# Patient Record
Sex: Female | Born: 1955 | Race: Black or African American | Hispanic: No | Smoking: Never smoker
Health system: Southern US, Community
[De-identification: ages and names within clinical notes are randomized; demographics above are authoritative.]

## PROBLEM LIST (undated history)

## (undated) DIAGNOSIS — D649 Anemia, unspecified: Secondary | ICD-10-CM

## (undated) DIAGNOSIS — G4730 Sleep apnea, unspecified: Secondary | ICD-10-CM

## (undated) DIAGNOSIS — K279 Peptic ulcer, site unspecified, unspecified as acute or chronic, without hemorrhage or perforation: Secondary | ICD-10-CM

## (undated) DIAGNOSIS — R7303 Prediabetes: Secondary | ICD-10-CM

## (undated) DIAGNOSIS — Z8669 Personal history of other diseases of the nervous system and sense organs: Secondary | ICD-10-CM

## (undated) DIAGNOSIS — G43909 Migraine, unspecified, not intractable, without status migrainosus: Secondary | ICD-10-CM

## (undated) DIAGNOSIS — D219 Benign neoplasm of connective and other soft tissue, unspecified: Secondary | ICD-10-CM

## (undated) DIAGNOSIS — K5792 Diverticulitis of intestine, part unspecified, without perforation or abscess without bleeding: Secondary | ICD-10-CM

## (undated) DIAGNOSIS — I10 Essential (primary) hypertension: Secondary | ICD-10-CM

## (undated) DIAGNOSIS — J189 Pneumonia, unspecified organism: Secondary | ICD-10-CM

## (undated) HISTORY — DX: Diverticulitis of intestine, part unspecified, without perforation or abscess without bleeding: K57.92

## (undated) HISTORY — PX: BLADDER SURGERY: SHX569

## (undated) HISTORY — DX: Personal history of other diseases of the nervous system and sense organs: Z86.69

## (undated) HISTORY — DX: Morbid (severe) obesity due to excess calories: E66.01

## (undated) HISTORY — DX: Benign neoplasm of connective and other soft tissue, unspecified: D21.9

## (undated) HISTORY — DX: Anemia, unspecified: D64.9

## (undated) HISTORY — DX: Peptic ulcer, site unspecified, unspecified as acute or chronic, without hemorrhage or perforation: K27.9

## (undated) HISTORY — DX: Prediabetes: R73.03

## (undated) HISTORY — DX: Migraine, unspecified, not intractable, without status migrainosus: G43.909

## (undated) HISTORY — PX: DILATION AND CURETTAGE OF UTERUS: SHX78

---

## 1990-06-30 HISTORY — PX: TIBIA FRACTURE SURGERY: SHX806

## 1996-06-30 DIAGNOSIS — K279 Peptic ulcer, site unspecified, unspecified as acute or chronic, without hemorrhage or perforation: Secondary | ICD-10-CM

## 1996-06-30 HISTORY — DX: Peptic ulcer, site unspecified, unspecified as acute or chronic, without hemorrhage or perforation: K27.9

## 1999-09-17 ENCOUNTER — Other Ambulatory Visit: Admission: RE | Admit: 1999-09-17 | Discharge: 1999-09-17 | Payer: Self-pay | Admitting: Obstetrics & Gynecology

## 2002-02-23 ENCOUNTER — Encounter: Admission: RE | Admit: 2002-02-23 | Discharge: 2002-02-23 | Payer: Self-pay | Admitting: Internal Medicine

## 2002-02-23 ENCOUNTER — Encounter: Payer: Self-pay | Admitting: Internal Medicine

## 2002-03-11 ENCOUNTER — Ambulatory Visit (HOSPITAL_COMMUNITY): Admission: RE | Admit: 2002-03-11 | Discharge: 2002-03-11 | Payer: Self-pay | Admitting: Gastroenterology

## 2002-03-11 ENCOUNTER — Encounter: Payer: Self-pay | Admitting: Gastroenterology

## 2002-04-18 ENCOUNTER — Other Ambulatory Visit: Admission: RE | Admit: 2002-04-18 | Discharge: 2002-04-18 | Payer: Self-pay | Admitting: Obstetrics & Gynecology

## 2002-08-17 ENCOUNTER — Ambulatory Visit: Admission: RE | Admit: 2002-08-17 | Discharge: 2002-08-17 | Payer: Self-pay | Admitting: Gastroenterology

## 2003-06-09 ENCOUNTER — Other Ambulatory Visit: Admission: RE | Admit: 2003-06-09 | Discharge: 2003-06-09 | Payer: Self-pay | Admitting: Obstetrics & Gynecology

## 2004-06-11 ENCOUNTER — Other Ambulatory Visit: Admission: RE | Admit: 2004-06-11 | Discharge: 2004-06-11 | Payer: Self-pay | Admitting: Obstetrics & Gynecology

## 2005-07-08 ENCOUNTER — Other Ambulatory Visit: Admission: RE | Admit: 2005-07-08 | Discharge: 2005-07-08 | Payer: Self-pay | Admitting: Obstetrics & Gynecology

## 2007-07-05 ENCOUNTER — Emergency Department (HOSPITAL_COMMUNITY): Admission: EM | Admit: 2007-07-05 | Discharge: 2007-07-05 | Payer: Self-pay | Admitting: Family Medicine

## 2009-12-19 ENCOUNTER — Ambulatory Visit (HOSPITAL_COMMUNITY): Admission: RE | Admit: 2009-12-19 | Discharge: 2009-12-19 | Payer: Self-pay | Admitting: Surgery

## 2009-12-25 ENCOUNTER — Ambulatory Visit (HOSPITAL_COMMUNITY)
Admission: RE | Admit: 2009-12-25 | Discharge: 2009-12-25 | Payer: Self-pay | Source: Home / Self Care | Admitting: Surgery

## 2010-01-14 ENCOUNTER — Ambulatory Visit (HOSPITAL_COMMUNITY): Admission: RE | Admit: 2010-01-14 | Discharge: 2010-01-14 | Payer: Self-pay | Admitting: Surgery

## 2010-01-22 ENCOUNTER — Ambulatory Visit (HOSPITAL_BASED_OUTPATIENT_CLINIC_OR_DEPARTMENT_OTHER)
Admission: RE | Admit: 2010-01-22 | Discharge: 2010-01-22 | Payer: Self-pay | Source: Home / Self Care | Admitting: Surgery

## 2010-01-26 ENCOUNTER — Ambulatory Visit: Payer: Self-pay | Admitting: Internal Medicine

## 2010-02-11 ENCOUNTER — Encounter: Admission: RE | Admit: 2010-02-11 | Discharge: 2010-03-29 | Payer: Self-pay | Admitting: Surgery

## 2010-11-15 NOTE — Op Note (Signed)
   NAME:  Jacqueline Mcclure, Jacqueline Mcclure                       ACCOUNT NO.:  [id number]   MEDICAL RECORD NO.:  [id number]                   PATIENT TYPE:  AMB   LOCATION:  [facility]                                 FACILITY:  [facility]   PHYSICIAN:  Anselmo Rod, M.D.               DATE OF BIRTH:  1955-09-15   DATE OF PROCEDURE:  08/17/2002  DATE OF DISCHARGE:                                 OPERATIVE REPORT   PROCEDURE:  Esophagogastroduodenoscopy.   ENDOSCOPIST:  Anselmo Rod, M.D.   INSTRUMENT USED:  Olympus video panendoscope.   INDICATION FOR PROCEDURE:  A 55 year old African-American female with  abdominal pain.  Rule out peptic ulcer disease, esophagitis, gastritis, etc.  The patient has been on Prevacid but has had recurrent reflux.   PREPROCEDURE PREPARATION:  Informed consent was procured from the patient.  The patient fasted for eight hours prior to the procedure.   PREPROCEDURE PHYSICAL:  VITAL SIGNS:  The patient had stable vital signs.  NECK:  Supple.  CHEST:  Clear to auscultation.  S1, S2 regular.  ABDOMEN:  Obese with epigastric tenderness on palpation with guarding.  No  rebound or rigidity, no hepatosplenomegaly.   DESCRIPTION OF PROCEDURE:  The patient was placed in the left lateral  decubitus position and sedated with 80 mg of Demerol and 9 mg of Versed  intravenously.  Once the patient was adequately sedate and maintained on low-  flow oxygen and continuous cardiac monitoring, the Olympus video  panendoscope was advanced through the mouthpiece, over the tongue, into the  esophagus under direct vision.  The entire esophagus appeared normal with no  evidence of ring, stricture, masses, esophagitis, or Barrett's mucosa.  The  scope was then advanced into the stomach.  Entire gastric mucosa and the  proximal small bowel appeared normal.  Retroflexion revealed no acute  abnormality.  The patient tolerated the procedure well without  complications.   IMPRESSION:  Normal  EGD.   RECOMMENDATIONS:  1. Continue PPIs.  2.     Avoid nonsteroidals.  3. Follow antireflux measures.  4. CT scan of the abdomen and pelvis if symptoms persist.                                               Anselmo Rod, M.D.    JNM/MEDQ  D:  08/17/2002  T:  08/17/2002  Job:  [id number]   cc:   Ike Bene, M.D.  [address]  [place]  [place] [address]  Fax: [phone]

## 2011-03-20 LAB — I-STAT 8, (EC8 V) (CONVERTED LAB)
Acid-base deficit: 1
BUN: 8
Bicarbonate: 27.2 — ABNORMAL HIGH
Chloride: 108
Glucose, Bld: 94
HCT: 42
Hemoglobin: 14.3
Operator id: 146091
Potassium: 4
Sodium: 139
TCO2: 29
pCO2, Ven: 56.6 — ABNORMAL HIGH
pH, Ven: 7.29

## 2011-03-20 LAB — CBC
HCT: 37.5
Hemoglobin: 12.3
MCHC: 32.8
MCV: 79.5
Platelets: 302
RBC: 4.72
RDW: 14.5
WBC: 8.4

## 2011-03-20 LAB — POCT CARDIAC MARKERS
Myoglobin, poc: 124
Operator id: 146091

## 2011-03-20 LAB — DIFFERENTIAL
Eosinophils Relative: 1
Lymphs Abs: 1.4
Monocytes Relative: 7

## 2011-03-20 LAB — D-DIMER, QUANTITATIVE: D-Dimer, Quant: 0.48

## 2012-01-20 ENCOUNTER — Other Ambulatory Visit: Payer: Self-pay | Admitting: Gastroenterology

## 2012-01-20 DIAGNOSIS — R1031 Right lower quadrant pain: Secondary | ICD-10-CM

## 2012-01-23 ENCOUNTER — Ambulatory Visit
Admission: RE | Admit: 2012-01-23 | Discharge: 2012-01-23 | Disposition: A | Discharge status: Home / Self Care | Payer: Managed Care, Other (non HMO) | Source: Ambulatory Visit | Attending: Gastroenterology | Admitting: Gastroenterology

## 2012-01-23 DIAGNOSIS — R1031 Right lower quadrant pain: Secondary | ICD-10-CM

## 2012-01-23 MED ORDER — IOHEXOL 300 MG/ML  SOLN
125.0000 mL | Freq: Once | INTRAMUSCULAR | Status: AC | PRN
  Administered 2012-01-23: 125 mL via INTRAVENOUS

## 2012-03-03 ENCOUNTER — Encounter (INDEPENDENT_AMBULATORY_CARE_PROVIDER_SITE_OTHER): Payer: Managed Care, Other (non HMO) | Admitting: Surgery

## 2012-04-16 ENCOUNTER — Other Ambulatory Visit: Payer: Self-pay | Admitting: Gastroenterology

## 2012-04-19 ENCOUNTER — Encounter (HOSPITAL_COMMUNITY): Payer: Self-pay | Admitting: *Deleted

## 2012-04-19 ENCOUNTER — Encounter (HOSPITAL_COMMUNITY)
Admission: RE | Disposition: A | Discharge status: Home / Self Care | Payer: Self-pay | Source: Ambulatory Visit | Attending: Gastroenterology

## 2012-04-19 ENCOUNTER — Ambulatory Visit (HOSPITAL_COMMUNITY)
Admission: RE | Admit: 2012-04-19 | Discharge: 2012-04-19 | Disposition: A | Discharge status: Home / Self Care | Payer: Managed Care, Other (non HMO) | Source: Ambulatory Visit | Attending: Gastroenterology | Admitting: Gastroenterology

## 2012-04-19 DIAGNOSIS — Z79899 Other long term (current) drug therapy: Secondary | ICD-10-CM | POA: Insufficient documentation

## 2012-04-19 DIAGNOSIS — I10 Essential (primary) hypertension: Secondary | ICD-10-CM | POA: Insufficient documentation

## 2012-04-19 DIAGNOSIS — G4730 Sleep apnea, unspecified: Secondary | ICD-10-CM | POA: Insufficient documentation

## 2012-04-19 DIAGNOSIS — Z1211 Encounter for screening for malignant neoplasm of colon: Secondary | ICD-10-CM | POA: Insufficient documentation

## 2012-04-19 HISTORY — PX: COLONOSCOPY: SHX5424

## 2012-04-19 HISTORY — DX: Sleep apnea, unspecified: G47.30

## 2012-04-19 HISTORY — DX: Essential (primary) hypertension: I10

## 2012-04-19 SURGERY — COLONOSCOPY
Anesthesia: Moderate Sedation

## 2012-04-19 MED ORDER — FENTANYL CITRATE 0.05 MG/ML IJ SOLN
INTRAMUSCULAR | Status: AC
  Filled 2012-04-19: qty 4

## 2012-04-19 MED ORDER — MIDAZOLAM HCL 10 MG/2ML IJ SOLN
INTRAMUSCULAR | Status: AC
  Filled 2012-04-19: qty 4

## 2012-04-19 MED ORDER — FENTANYL CITRATE 0.05 MG/ML IJ SOLN
INTRAMUSCULAR | Status: DC | PRN
  Administered 2012-04-19 (×3): 25 ug via INTRAVENOUS

## 2012-04-19 MED ORDER — MIDAZOLAM HCL 10 MG/2ML IJ SOLN
INTRAMUSCULAR | Status: DC | PRN
  Administered 2012-04-19 (×3): 2.5 mg via INTRAVENOUS

## 2012-04-19 MED ORDER — SODIUM CHLORIDE 0.9 % IV SOLN
INTRAVENOUS | Status: DC
  Administered 2012-04-19: 500 mL via INTRAVENOUS

## 2012-04-19 NOTE — Op Note (Signed)
[facility] [address] [place], [address]   OPERATIVE PROCEDURE REPORT  PATIENT: Jacqueline Mcclure, Jacqueline Mcclure  MR#: [id number] BIRTHDATE: 10/05/1955 GENDER: Female ENDOSCOPIST: Dr.  Lorenza Burton, MD ASSISTANT:   Karie Soda, Technician Debi Mays, RN, [facility] PROCEDURE DATE: 04/19/2012  PRE-PROCEDURE PREPARATION: The patient was prepped with 32 oz.  of Suprep the night before the procedure and 32 oz.  the morning of the procedure.  The patient was fasted for 4 hours prior to the procedure. PRE-PROCEDURE PHYSICAL: Patient has stable vital signs.  Neck is supple.  There is no JVD, thyromegaly or LAD.  Chest clear to auscultation.  S1 and S2 regular.  Abdomen soft, morbidly obese, non-distended, non-tender with NABS. PROCEDURE: Colonoscopy with hot snare polypectomy x 1 and cold biopsy x 1. ASA CLASS: Class III INDICATIONS: 1. Colorectal cancer screening-average risk patient. MEDICATIONS:  Fentanyl 75 mcg and Versed 7 mg IV .  DESCRIPTION OF PROCEDURE:   After the risks, benefits, and alternatives of the procedure were thoroughly explained [including a 10% missed rate of cancer and polyps], informed consent was obtained.  Digital rectal exam was performed.  The Pentax Colonoscope [id number]  was introduced through the anus  and advanced to the cecum, which was identified by both the appendix and ileocecal valve , limited by No adverse events experienced.   The quality of the prep was Suprep good . Multiple washes were done. Small lesions could be missed. The instrument was then slowly withdrawn as the colon was fully examined.     COLON FINDINGS: A diminutive polyp was removed by cold biopsy from the rectosigmoid colon. A 5-6 mm sessile polyp was removed from the rectum using a snare cautery 150/15 x 1 . The resection was complete and the polyp tissue was completely retrieved. Scattered diverticula were noted throughout the colon. The rest of the colonic mucosa  appeared healthy with a normal vascular pattern.  No masses or AVMs were noted.  The appendiceal orifice and the ICV were identified and photographed. Retroflexed views revealed small internal hemorrhoids. The patient tolerated the procedure without immediate complications.  The scope was then withdrawn from the patient and the procedure terminated.  TIME TO CECUM:  14 minutes 0 seconds WITHDRAW TIME:  8 minutes 0 seconds  IMPRESSION:     1) Diminutive rectosigmoid polyp removed by cold biopsy x 1. 2) Sessile 5-6 mm rectal polyp removed by a hot snare x 1. 3) Scattered diverticulosis.   RECOMMENDATIONS:     1.  Await pathology results. 2.  Continue surveillance. 3.  High fiber diet with liberal fluid intake. 4.  OP follow-up is advised on a PRN basis.   REPEAT EXAM:      In 5 years  for a repeat colonoscopy.  If the patient has any abnormal GI symptoms in the interim, they have been advised to contact the office as soon as possible for further recommendations.   CPT CODES:     [id number], Colonoscopy with snare polypectomy  DIAGNOSIS CODES:     V76.51, 211.3, 562.10, 455.0  REFERRED YQ:MVHQIO Ehinger, M.D.  Ilda Mori, M.D.  eSigned:  Dr. Lorenza Burton, MD 04/19/2012 3:25 PM   PATIENT NAME:  Jacqueline Mcclure, Jacqueline Mcclure MR#: [id number]

## 2012-04-19 NOTE — H&P (Signed)
Jacqueline Mcclure is an 56 y.o. female.   Chief Complaint: Colorectal cancer screening. HPI:  Patient is here for colorectal cancer screening. She denies any new GI symtoms at this time.  Past Medical History  Diagnosis Date  . Hypertension   . Sleep apnea     No CPAP use    Past Surgical History  Procedure Date  . Tibia fracture surgery 1992    8 pins  . Knee surgery 1992    2 pins  . Bladder surgery     Stretched  . Dilation and curettage of uterus     History reviewed. No pertinent family history. Social History:  reports that she has never smoked. She does not have any smokeless tobacco history on file. She reports that she does not drink alcohol or use illicit drugs.  Allergies: Not on File  Medications Prior to Admission  Medication Sig Dispense Refill  . hydrochlorothiazide (HYDRODIURIL) 25 MG tablet Take 25 mg by mouth daily.      Marland Kitchen lisinopril (PRINIVIL,ZESTRIL) 20 MG tablet Take 20 mg by mouth daily.       No results found for this or any previous visit (from the past 48 hour(s)). No results found.  Review of Systems  Constitutional: Negative.   Eyes: Negative.   Respiratory: Negative.   Cardiovascular: Negative.   Genitourinary: Negative.   Musculoskeletal: Positive for myalgias, back pain and joint pain.  Skin: Negative.     Blood pressure 112/73, pulse 95, temperature 98.1 F (36.7 C), temperature source Oral, resp. rate 12, height 5\' 5"  (1.651 m), weight 173.728 kg (383 lb), SpO2 97.00%. Physical Exam  Constitutional: She is oriented to person, place, and time. She appears well-developed and well-nourished.  HENT:  Head: Normocephalic and atraumatic.  Eyes: Conjunctivae normal and EOM are normal. Pupils are equal, round, and reactive to light.  Neck: Normal range of motion. Neck supple.  Cardiovascular: Normal rate and regular rhythm.   Respiratory: Effort normal and breath sounds normal.  GI: Soft. Bowel sounds are normal.  Musculoskeletal: Normal  range of motion.  Neurological: She is alert and oriented to person, place, and time.  Skin: Skin is warm and dry.  Psychiatric: She has a normal mood and affect. Her behavior is normal. Judgment and thought content normal.     Assessment/Plan Colorectal cancer screening: proceed with a colonoscopy at this time.  Jacqueline Mcclure 04/19/2012, 2:38 PM

## 2012-04-20 ENCOUNTER — Encounter (HOSPITAL_COMMUNITY): Payer: Self-pay | Admitting: Gastroenterology

## 2012-06-26 ENCOUNTER — Emergency Department (HOSPITAL_BASED_OUTPATIENT_CLINIC_OR_DEPARTMENT_OTHER)
Admission: EM | Admit: 2012-06-26 | Discharge: 2012-06-26 | Disposition: A | Discharge status: Home / Self Care | Payer: Managed Care, Other (non HMO) | Attending: Emergency Medicine | Admitting: Emergency Medicine

## 2012-06-26 ENCOUNTER — Encounter (HOSPITAL_BASED_OUTPATIENT_CLINIC_OR_DEPARTMENT_OTHER): Payer: Self-pay | Admitting: *Deleted

## 2012-06-26 ENCOUNTER — Emergency Department (HOSPITAL_BASED_OUTPATIENT_CLINIC_OR_DEPARTMENT_OTHER): Payer: Managed Care, Other (non HMO)

## 2012-06-26 DIAGNOSIS — R197 Diarrhea, unspecified: Secondary | ICD-10-CM | POA: Insufficient documentation

## 2012-06-26 DIAGNOSIS — I10 Essential (primary) hypertension: Secondary | ICD-10-CM | POA: Insufficient documentation

## 2012-06-26 DIAGNOSIS — J40 Bronchitis, not specified as acute or chronic: Secondary | ICD-10-CM

## 2012-06-26 DIAGNOSIS — R0602 Shortness of breath: Secondary | ICD-10-CM | POA: Insufficient documentation

## 2012-06-26 DIAGNOSIS — J3489 Other specified disorders of nose and nasal sinuses: Secondary | ICD-10-CM | POA: Insufficient documentation

## 2012-06-26 DIAGNOSIS — Z79899 Other long term (current) drug therapy: Secondary | ICD-10-CM | POA: Insufficient documentation

## 2012-06-26 DIAGNOSIS — Z8709 Personal history of other diseases of the respiratory system: Secondary | ICD-10-CM | POA: Insufficient documentation

## 2012-06-26 MED ORDER — ALBUTEROL SULFATE HFA 108 (90 BASE) MCG/ACT IN AERS
2.0000 | INHALATION_SPRAY | RESPIRATORY_TRACT | Status: DC | PRN
  Administered 2012-06-26: 2 via RESPIRATORY_TRACT
  Filled 2012-06-26: qty 6.7

## 2012-06-26 NOTE — ED Notes (Addendum)
States that she has a cough, congestion, and headache since last week. Was given amoxicillin last week but stopped taking it because it  Made her sick. Also states she took 3 days of a z-pack

## 2012-06-26 NOTE — ED Provider Notes (Signed)
History   This chart was scribed for Ethelda Chick, MD scribed by Magnus Sinning. The patient was seen in room MH09/MH09 at 15:45   CSN: [id number]  Arrival date & time 06/26/12  1509    Chief Complaint  Patient presents with  . Cough    (Consider location/radiation/quality/duration/timing/severity/associated sxs/prior treatment) HPI Comments: Jacqueline Mcclure is a 56 y.o. female who presents to the Emergency Department complaining of constant moderate cough, onset one week with associated congestion, sinus pressure, SOB with exertion, wheezing, and diarrhea.  The patient states her illness began with a head cold that she treated with OTC medications with relief.   One week ago,she reportedly began having constant moderate cough with congestion. She was seen at [facility] walk-in clinic and given amoxil six days ago. She states she only took it for three days and stopped it because she believed it was causing stomach upset and abd sxs.  She was seen again by a provided new onset right sided sinus pressure. She says she was started on a Z-pack  three days ago and completed today. She states she has been eating and drinking,but provides her mouth continually feels dry. Also reports diarrhea yesterday for one hour and loose stool today. She states previous abd pain that she says is resolved currently.  Does report hx of bronchitis and hx of HTN, but denies alcohol use, or smoking.   Patient is a 56 y.o. female presenting with cough. The history is provided by the patient. No language interpreter was used.  Cough This is a new problem. The current episode started more than 1 week ago. The problem occurs constantly. The problem has been gradually worsening. There has been no fever. Associated symptoms include shortness of breath and wheezing. The treatment provided mild relief. She is not a smoker. Her past medical history is significant for bronchitis.    Past Medical History  Diagnosis Date    . Hypertension   . Sleep apnea     No CPAP use    Past Surgical History  Procedure Date  . Tibia fracture surgery 1992    8 pins  . Knee surgery 1992    2 pins  . Bladder surgery     Stretched  . Dilation and curettage of uterus   . Colonoscopy 04/19/2012    Procedure: COLONOSCOPY;  Surgeon: Charna Elizabeth, MD;  Location: [facility];  Service: Endoscopy;  Laterality: N/A;    No family history on file.  History  Substance Use Topics  . Smoking status: Never Smoker   . Smokeless tobacco: Not on file  . Alcohol Use: No    Review of Systems  HENT: Positive for congestion and sinus pressure.   Respiratory: Positive for cough, shortness of breath and wheezing.   Gastrointestinal: Positive for abdominal pain and diarrhea.  All other systems reviewed and are negative.    Allergies  Review of patient's allergies indicates no known allergies.  Home Medications   Current Outpatient Rx  Name  Route  Sig  Dispense  Refill  . HYDROCHLOROTHIAZIDE 25 MG PO TABS   Oral   Take 25 mg by mouth daily.         Marland Kitchen LISINOPRIL 20 MG PO TABS   Oral   Take 20 mg by mouth daily.           BP 124/76  Pulse 97  Temp 97.8 F (36.6 C) (Oral)  Resp 18  Ht 5\' 5"  (1.651 m)  Wt 390  lb (176.903 kg)  BMI 64.90 kg/m2  SpO2 99%  Physical Exam  Nursing note and vitals reviewed. Constitutional: She is oriented to person, place, and time. She appears well-developed and well-nourished. No distress.  HENT:  Head: Normocephalic and atraumatic.  Mouth/Throat: Oropharynx is clear and moist. No oropharyngeal exudate or posterior oropharyngeal erythema.  Eyes: Conjunctivae normal and EOM are normal.  Neck: Neck supple. No tracheal deviation present.  Cardiovascular: Normal rate, regular rhythm and normal heart sounds.   Pulmonary/Chest: Effort normal and breath sounds normal. No respiratory distress. She has no wheezes. She has no rales.  Abdominal: Soft. Bowel sounds are normal. She  exhibits no distension. There is no tenderness.  Musculoskeletal: Normal range of motion. She exhibits no edema.  Neurological: She is alert and oriented to person, place, and time. No sensory deficit.  Skin: Skin is dry.  Psychiatric: She has a normal mood and affect. Her behavior is normal.    ED Course  Procedures (including critical care time) DIAGNOSTIC STUDIES: Oxygen Saturation is 99% on room air, normal by my interpretation.    COORDINATION OF CARE: 15:49: Physical exam performed.  Labs Reviewed - No data to display Dg Chest 2 View  06/26/2012  *RADIOLOGY REPORT*  Clinical Data: Cough, congestion.  Headaches since last week. History of hypertension, sleep apnea.  CHEST - 2 VIEW  Comparison: 12/25/2009 and earlier  Findings: The cardiomediastinal silhouette is within normal limits. Lungs are free of focal consolidations and pleural effusions.  No pulmonary edema.  Degenerative changes are seen in the lower thoracic spine.  IMPRESSION: No evidence for acute cardiopulmonary abnormality.   Original Report Authenticated By: Norva Pavlov, M.D.      1. Bronchitis       MDM  Pt presents with c/o cough, nasal congestion, generalized fatigue- has taken 3 days of amoxicillin and 3 day course of zpack which finished today.  Her lungs are clear, abdomen benign, she appears well hydrated.  CXR reassuring- images reviewed by me as well.  Pt given albuterol inhaler in case of post viral bronchitis.  She is a nonsmoker.  Discharged with strict return precautions.  Pt agreeable with plan.   I personally performed the services described in this documentation, which was scribed in my presence. The recorded information has been reviewed and is accurate.         Ethelda Chick, MD 06/27/12 1536

## 2012-07-30 ENCOUNTER — Encounter (INDEPENDENT_AMBULATORY_CARE_PROVIDER_SITE_OTHER): Payer: Self-pay

## 2013-05-03 ENCOUNTER — Other Ambulatory Visit: Payer: Self-pay | Admitting: Obstetrics & Gynecology

## 2013-05-03 DIAGNOSIS — R928 Other abnormal and inconclusive findings on diagnostic imaging of breast: Secondary | ICD-10-CM

## 2013-05-20 ENCOUNTER — Ambulatory Visit
Admission: RE | Admit: 2013-05-20 | Discharge: 2013-05-20 | Disposition: A | Discharge status: Home / Self Care | Payer: Managed Care, Other (non HMO) | Source: Ambulatory Visit | Attending: Obstetrics & Gynecology | Admitting: Obstetrics & Gynecology

## 2013-05-20 DIAGNOSIS — R928 Other abnormal and inconclusive findings on diagnostic imaging of breast: Secondary | ICD-10-CM

## 2013-12-16 ENCOUNTER — Ambulatory Visit (HOSPITAL_COMMUNITY): Admit: 2013-12-16 | Payer: Managed Care, Other (non HMO) | Admitting: Obstetrics & Gynecology

## 2013-12-16 ENCOUNTER — Encounter (HOSPITAL_COMMUNITY): Payer: Self-pay

## 2013-12-16 SURGERY — DILATATION AND CURETTAGE /HYSTEROSCOPY
Anesthesia: Choice

## 2014-01-19 ENCOUNTER — Other Ambulatory Visit (HOSPITAL_COMMUNITY): Payer: Self-pay | Admitting: Surgery

## 2014-01-23 ENCOUNTER — Other Ambulatory Visit (HOSPITAL_COMMUNITY): Payer: Self-pay | Admitting: Surgery

## 2014-01-27 ENCOUNTER — Ambulatory Visit (HOSPITAL_COMMUNITY)
Admission: RE | Admit: 2014-01-27 | Discharge: 2014-01-27 | Disposition: A | Discharge status: Home / Self Care | Payer: Managed Care, Other (non HMO) | Source: Ambulatory Visit | Attending: Surgery | Admitting: Surgery

## 2014-02-28 ENCOUNTER — Ambulatory Visit (INDEPENDENT_AMBULATORY_CARE_PROVIDER_SITE_OTHER): Payer: Managed Care, Other (non HMO) | Admitting: Psychiatry

## 2014-02-28 ENCOUNTER — Encounter (HOSPITAL_COMMUNITY): Payer: Self-pay | Admitting: Psychiatry

## 2014-02-28 VITALS — BP 110/73 | HR 98 | Wt 372.6 lb

## 2014-02-28 DIAGNOSIS — Z0389 Encounter for observation for other suspected diseases and conditions ruled out: Secondary | ICD-10-CM

## 2014-02-28 DIAGNOSIS — F489 Nonpsychotic mental disorder, unspecified: Secondary | ICD-10-CM

## 2014-02-28 NOTE — Progress Notes (Signed)
Psychiatric Assessment Adult  Patient Identification:  Jacqueline Mcclure Date of Evaluation:  02/28/2014 Chief Complaint: I was sent for a bariatric evaluation  History of Chief Complaint:   Chief Complaint  Patient presents with  . Other    HPI Comments: Pt wants to have bariatric surgery. Her weight has increased to the point that she is struggling physically. Weight hinders her from a lot of activities. States her weight causes other people to look at her and makes her self conscious.   Pt enjoys eating "junky food that is not healthy". She likes to eat things that taste good. Pt eats somewhere 1-3 meals/day. States meals are a regular size and she does not super size. Pt snacks in between meals. She likes cheeseburgers, Oreo cookie milkshakes, fries, pizza as regular meals. In between meals she will eat ice cream, peanut butter M&M's, candy, milkshakes. Pt will eat about 1/3 of a bag of candy at each snack.  Most of her snacking occurs once in the evening while at work and again at night when she goes home. States she has control on the amount she eats and she eats until she is full then stops. Her biggest issues are with her food choices. Pt craves carbs and junky food.   Pt began gaining weight when she was 15. At 18 she tried diet pills and she lost 80 lbs. She was able to keep the weight off for several years. Weight began to increase again and she tried more diet pills and weight clinics. States she would get results but was never able to maintain the weight loss.   Sleep is poor due to fear of the "buggy man". States she has dreams and when she wakes up she has VH. It makes her scared and not want to sleep. Pt also possibility has untreated sleep apnea. Energy and concentration are good. She is working and productive.     Review of Systems Physical Exam  Constitutional: She is cooperative.  Psychiatric: She has a normal mood and affect. Her speech is normal and behavior is normal.  Judgment and thought content normal. Cognition and memory are normal.    Depressive Symptoms: depressed mood about her weight and especially when shopping. States overall mood is ok and she is not depressed on a daily basis. Denies anhedonia, worthlessness and hopelessness. Denies SI/HI.   (Hypo) Manic Symptoms:   Elevated Mood:  No Irritable Mood:  No Grandiosity:  No Distractibility:  No Labiality of Mood:  No Delusions:  No Hallucinations:  No Impulsivity:  No Sexually Inappropriate Behavior:  No Financial Extravagance:  No Flight of Ideas:  No  Anxiety Symptoms: Excessive Worry:  No Panic Symptoms:  No Agoraphobia:  No Obsessive Compulsive: No  Symptoms: None, Specific Phobias:  No Social Anxiety:  No  Psychotic Symptoms:  Hallucinations: No None Delusions:  No Paranoia:  No   Ideas of Reference:  No  PTSD Symptoms: Ever had a traumatic exposure:  No Had a traumatic exposure in the last month:  No Re-experiencing: No None Hypervigilance:  No Hyperarousal: No None Avoidance: No None  Traumatic Brain Injury: No   Past Psychiatric History: Diagnosis: denies  Hospitalizations: denies  Outpatient Care: went for psych eval for lap band surgery in 2008  Substance Abuse Care: denies  Self-Mutilation: denies  Suicidal Attempts: denies, denies access to guns.   Violent Behaviors: denies   Past Medical History:   Past Medical History  Diagnosis Date  . Hypertension   .  Sleep apnea     No CPAP use   History of Loss of Consciousness:  No Seizure History:  No Cardiac History:  No Allergies:  No Known Allergies Current Medications:  Current Outpatient Prescriptions  Medication Sig Dispense Refill  . hydrochlorothiazide (HYDRODIURIL) 25 MG tablet Take 25 mg by mouth daily.      Marland Kitchen lisinopril (PRINIVIL,ZESTRIL) 20 MG tablet Take 20 mg by mouth daily.       No current facility-administered medications for this visit.    Previous Psychotropic  Medications:  Medication Dose   denies                       Substance Abuse History in the last 12 months: Substance Age of 1st Use Last Use Amount Specific Type  Nicotine  denies        Alcohol  denies        Cannabis  denies        Opiates  denies        Cocaine  denies        Methamphetamines  denies        LSD  denies        Ecstasy  denies         Benzodiazepines  denies        Caffeine    A few times a week about 1/2 bottle coke  Inhalants  denies        Others: denies                         Medical Consequences of Substance Abuse: denies  Legal Consequences of Substance Abuse: denies  Family Consequences of Substance Abuse: denies  Blackouts:  No DT's:  No Withdrawal Symptoms:  No None  Social History: Current Place of Residence: [place] with husband Place of Birth: [place] Family Members: parents, 1 bro and 1 sister Marital Status:  Married 54 yrs Children: 1  Sons: 1 in college  Daughters: 0 Relationships: support  From husband and a few friends Education:  2 yrs college Educational Problems/Performance: denies Religious Beliefs/Practices:Christian History of Abuse: none Occupational Experiences: Psychologist, clinical since 2008 Military History:  None. Legal History: denies Hobbies/Interests: bowling  Family History:   Family History  Problem Relation Age of Onset  . Suicidality Neg Hx   . Alcohol abuse Neg Hx   . Anxiety disorder Neg Hx   . Bipolar disorder Neg Hx   . Depression Neg Hx   . Drug abuse Neg Hx     Mental Status Examination/Evaluation: Objective: Attitude: Calm and cooperative  Appearance: Fairly Groomed, appears to be stated age  Eye Contact::  Good  Speech:  Clear and Coherent and Normal Rate  Volume:  Normal  Mood:  euthymic  Affect:  Full Range  Thought Process:  Goal Directed, Intact, Linear and Logical  Orientation:  Full (Time, Place, and Person)  Thought Content:  Negative  Suicidal Thoughts:   No  Homicidal Thoughts:  No  Judgement:  Good  Insight:  Good  Concentration: good  Memory: Immediate-fair Recent-fair Remote-fair  Recall: fair  Language: fair  Gait and Station: normal  [employer] of Knowledge: average  Psychomotor Activity:  Normal  Akathisia:  No  Handed:  Right  AIMS (if indicated):  n/a  Assets:  Communication Skills Desire for Improvement Financial Resources/Insurance Housing Intimacy Leisure Time Social Support Heritage manager  Laboratory/X-Ray Psychological Evaluation(s)  None available to review None available to review   Assessment:  Pt reported VH appear to normal physiological response. No signs of an eating disorder. Weight issues appear to be more related to poor food choices. No reported symptoms of depression, bipolar or anxiety.   AXIS I No axis I diagnosis  AXIS II Deferred  AXIS III Past Medical History  Diagnosis Date  . Hypertension   . Sleep apnea     No CPAP use     AXIS IV poor food choices and health stressors  AXIS V 75   Treatment Plan/Recommendations:  Plan of Care: Pt denies all symptoms of psych illness. No further psych f/up needed at this time.   Laboratory:  none  Psychotherapy: Therapy: brief supportive therapy provided. Discussed psychosocial stressors in detail.     Medications: none  Routine PRN Medications:  No  Consultations: none  Safety Concerns:  Pt denies SI and is at an acute low risk for suicide. Patient told to call clinic if any problems occur. Patient advised to go to ER if they should develop SI/HI, side effects, or if symptoms worsen. Has crisis numbers to call if needed. Pt verbalized understanding.   Other:  Prn f/up    Charlcie Cradle, MD 9/1/201511:16 AM

## 2014-03-09 ENCOUNTER — Ambulatory Visit (HOSPITAL_COMMUNITY): Payer: Managed Care, Other (non HMO) | Admitting: Psychiatry

## 2014-04-11 ENCOUNTER — Encounter: Payer: Self-pay | Admitting: General Surgery

## 2014-04-11 DIAGNOSIS — I10 Essential (primary) hypertension: Secondary | ICD-10-CM | POA: Insufficient documentation

## 2014-04-13 ENCOUNTER — Ambulatory Visit (HOSPITAL_BASED_OUTPATIENT_CLINIC_OR_DEPARTMENT_OTHER): Payer: Managed Care, Other (non HMO) | Attending: Surgery | Admitting: Radiology

## 2014-04-13 DIAGNOSIS — G4733 Obstructive sleep apnea (adult) (pediatric): Secondary | ICD-10-CM | POA: Diagnosis not present

## 2014-04-13 DIAGNOSIS — Z6841 Body Mass Index (BMI) 40.0 and over, adult: Secondary | ICD-10-CM | POA: Insufficient documentation

## 2014-04-13 DIAGNOSIS — G4710 Hypersomnia, unspecified: Secondary | ICD-10-CM | POA: Diagnosis present

## 2014-04-13 DIAGNOSIS — G4730 Sleep apnea, unspecified: Secondary | ICD-10-CM | POA: Diagnosis present

## 2014-04-15 NOTE — Sleep Study (Signed)
   NAME: Jacqueline Mcclure DATE OF BIRTH:  [birth year] MEDICAL RECORD NUMBER [id number]  LOCATION: [facility]  PHYSICIAN: YOUNG,CLINTON D  DATE OF STUDY: 04/13/2014  SLEEP STUDY TYPE: Nocturnal Polysomnogram               REFERRING PHYSICIAN: Shelah Lewandowsky, MD  INDICATION FOR STUDY: Hypersomnia with sleep apnea  EPWORTH SLEEPINESS SCORE:   7/24 HEIGHT: 5\' 5"  (165.1 cm)  WEIGHT: 372 lb (168.738 kg)    Body mass index is 61.9 kg/(m^2).  NECK SIZE: 17 in.  MEDICATIONS: Charted for review  SLEEP ARCHITECTURE: Total sleep time 233.5 minutes with sleep efficiency 63.5%. Stage I was 6.4%, stage II 73%, stage III absent, REM 20.6% of total sleep time. Sleep latency 19 minutes, REM latency 111.5 minutes, awake after sleep onset 108.5 minutes, arousal index 11, bedtime medication: None  RESPIRATORY DATA: Apnea hypopneas index (AHI) 16.2 per hour. 63 total events scored including 16 obstructive apneas, 4 central apneas, 43 hypopneas. Events were not positional. REM AHI 65 per hour. There were not enough early events to permit application of split protocol CPAP titration on this study.  OXYGEN DATA: Mild to moderate snoring with oxygen desaturation to a nadir of 69% and mean saturation 93.3% on room air.  CARDIAC DATA: Normal sinus rhythm  MOVEMENT/PARASOMNIA: No significant movement disturbance, bathroom x1  IMPRESSION/ RECOMMENDATION:   1) Moderate obstructive sleep apnea/hypopneas syndrome, AHI 16.2 per hour with non-positional events. REM AHI 65 per hour. Mild to moderate snoring with oxygen desaturation to a nadir of 69% and mean saturation 93.3% on room air. 2) There were not enough early events to permit application of split protocol CPAP titration on this study. This patient can return for a dedicated CPAP titration study if appropriate.  3) A previous polysomnogram on 01/22/2010 recorded AHI 16.6 per hour with body weight 382 pounds.  Deneise Lever Diplomate,  American Board of Sleep Medicine  ELECTRONICALLY SIGNED ON:  04/15/2014, 2:05 PM [place] PH: [phone]   FX: [phone] [place]

## 2014-05-31 ENCOUNTER — Telehealth: Payer: Self-pay | Admitting: Interventional Cardiology

## 2014-05-31 NOTE — Telephone Encounter (Signed)
Called pt to get more info.lmtcb

## 2014-05-31 NOTE — Telephone Encounter (Signed)
Pt informed that she has not been seen by Dr.Smith in over a year. Pt will need an appt to have cardiac clearance granted for bariatric sx with Dr.Enochs. Pt sts that they are wanting to scheduled her appt this month. Adv her that Dr.Smith does not have any availability.I will fwd a message to the schedulers in our office to call pt to schedule a appt with the PA/NP for cardiac clearance.she verbalized understanding.

## 2014-05-31 NOTE — Telephone Encounter (Signed)
New problem   Pt stated she saw Dr Tamala Julian at old location and need to know if her bariatric forms has been received for Dr Tamala Julian to fill out. Please call pt is any questions.

## 2014-06-05 NOTE — Telephone Encounter (Signed)
Follow Up  Pt states that she was to receive a call to schedule for clearance. Made appt with pt with Ermalinda Barrios on 06/12/2014.

## 2014-06-09 ENCOUNTER — Ambulatory Visit (INDEPENDENT_AMBULATORY_CARE_PROVIDER_SITE_OTHER): Payer: Managed Care, Other (non HMO) | Admitting: Nurse Practitioner

## 2014-06-09 ENCOUNTER — Encounter: Payer: Self-pay | Admitting: Nurse Practitioner

## 2014-06-09 VITALS — BP 160/70 | HR 84 | Ht 65.0 in | Wt 377.0 lb

## 2014-06-09 DIAGNOSIS — Z01810 Encounter for preprocedural cardiovascular examination: Secondary | ICD-10-CM

## 2014-06-09 DIAGNOSIS — I10 Essential (primary) hypertension: Secondary | ICD-10-CM

## 2014-06-09 DIAGNOSIS — E669 Obesity, unspecified: Secondary | ICD-10-CM | POA: Insufficient documentation

## 2014-06-09 NOTE — Addendum Note (Signed)
Addended by: Alvina Filbert B on: 06/09/2014 11:53 AM   Modules accepted: Orders, Medications

## 2014-06-09 NOTE — Progress Notes (Signed)
Patient Name: Jacqueline Mcclure Date of Encounter: 06/09/2014  Primary Care Provider:  Wenda Low, MD Primary Cardiologist:  Linard Millers, MD   Patient Profile  58 y/o female with a h/o HTN and morbid obesity who presents today for pre-operative clearance for bariatric surgery.  Problem List   Past Medical History  Diagnosis Date  . Hypertension   . Sleep apnea     No CPAP use  . Migraine   . Peptic ulcer disease 1998    normal EGD 2004  . Anemia   . Morbid obesity   . Diverticulitis 12/2011  . Borderline diabetes    Past Surgical History  Procedure Laterality Date  . Tibia fracture surgery  1992    8 pins  . Knee surgery  1992    2 pins  . Bladder surgery      Stretched  . Dilation and curettage of uterus    . Colonoscopy  04/19/2012    Procedure: COLONOSCOPY;  Surgeon: Juanita Craver, MD;  Location: [facility];  Service: Endoscopy;  Laterality: N/A;    Allergies  No Known Allergies  HPI  58 y/o female with the above problem list.  She reports that she had previously seen Dr. Tamala Julian r/t HTN about 2 yrs ago.  She is currently being evaluated for bariatric surgery by Dr. Docia Furl in [place], [place].  She tries to be active @ home though her walking is limited by hip and lower back pain.  That said, she does not routinely experience DOE when walking or with usual activities such as shopping or keeping house.  She does have DOE with higher levels of activity such as rushing up stairs.  She has never had chest pain.  She denies palpitations, pnd, orthopnea, n, v, dizziness, syncope, edema, weight gain, or early satiety.   Home Medications  Prior to Admission medications   Medication Sig Start Date End Date Taking? Authorizing Provider  lisinopril-hydrochlorothiazide (PRINZIDE,ZESTORETIC) 20-25 MG per tablet Take 1 tablet by mouth daily. 02/27/14  Yes Historical Provider, MD  ferrous fumarate (HEMOCYTE - 106 MG FE) 325 (106 FE) MG TABS tablet Take 1 tablet by mouth daily.     Historical Provider, MD  Prenatal Vit-Fe Fumarate-FA (M-VIT PO) Take by mouth daily.    Historical Provider, MD    Family History  Family History  Problem Relation Age of Onset  . Suicidality Neg Hx   . Alcohol abuse Neg Hx   . Anxiety disorder Neg Hx   . Bipolar disorder Neg Hx   . Depression Neg Hx   . Drug abuse Neg Hx   . Hypertension Mother   . Diabetes Mellitus I Mother   . Hypertension Father   . Lupus Father   . Prostate cancer Father   . Hypertension Brother   . Diabetes Mellitus I Brother   . CAD Father     a. s/p CABG and valve surgery @ 24.  . Valvular heart disease Father     Social History  History   Social History  . Marital Status: Married    Spouse Name: N/A    Number of Children: N/A  . Years of Education: N/A   Occupational History  . Not on file.   Social History Main Topics  . Smoking status: Never Smoker   . Smokeless tobacco: Never Used  . Alcohol Use: No  . Drug Use: No  . Sexual Activity: Yes   Other Topics Concern  . Not on file  Social History Narrative     Review of Systems General:  No chills, fever, night sweats or weight changes.  Cardiovascular:  No chest pain, +++ dyspnea on exertion with higher levels of activity.  No edema, orthopnea, palpitations, paroxysmal nocturnal dyspnea. Dermatological: No rash, lesions/masses Respiratory: No cough, dyspnea Urologic: No hematuria, dysuria Abdominal:   No nausea, vomiting, diarrhea, bright red blood per rectum, melena, or hematemesis Neurologic:  No visual changes, wkns, changes in mental status. All other systems reviewed and are otherwise negative except as noted above.  Physical Exam  Blood pressure 160/70, pulse 84, height 5\' 5"  (1.651 m), weight 377 lb (171.006 kg).  BP 120/78 on repeat evaluation. General: Pleasant, NAD Psych: Normal affect. Neuro: Alert and oriented X 3. Moves all extremities spontaneously. HEENT: Normal  Neck: Supple without bruits.  Difficult to  assess jvp 2/2 girth. Lungs:  Resp regular and unlabored, CTA. Heart: RRR, distant, no s3, s4, or murmurs. Abdomen: Obese, soft, non-tender, non-distended, BS + x 4.  Extremities: No clubbing, cyanosis or edema. DP/PT/Radials 2+ and equal bilaterally.  Accessory Clinical Findings  ECG - rsr, 84, no acute st/t changes.  Assessment & Plan  1.  Preoperative cardiovascular evaluation:  Overall pt does well at home w/o dyspnea or chest pain with usual activities such as walking @ a slow pace, shopping, or cleaning up around her house. She does experience DOE with higher levels of activity such as climbing stairs @ a rushed pace.  She has no prior h/o heart failure and denies pnd, orthopnea, early satiety, or edema.  Given her size and chest wall girth, she is a poor candidate for any additional ischemic or echocardiographic evaluation.  That said, with her reasonable exercise tolerance and overall lack of symptoms, no further testing is indicated at this time and she would be considered low risk for cardiac complications r/t pending bariatric surgery.  I've discussed her case with Dr. Fransico Him who concurs.  2.  HTN: BP initially elevated however 120/78 on repeat.  She remains on lisinopril/HCTZ.  3.  H/O Borderline DM:  She does not monitor sugars @ home.  Suspect this will improve post-operatively.  Cont ACEI.  4.  OSA: Likely to be impacted by bariatric surgery and weight loss.  5.  Dispo:  F/U Dr. Tamala Julian prn.   Murray Hodgkins, NP 06/09/2014, 9:15 AM

## 2014-06-09 NOTE — Patient Instructions (Signed)
Your physician recommends that you continue on your current medications as directed. Please refer to the Current Medication list given to you today.  Follow up as needed  

## 2014-06-12 ENCOUNTER — Ambulatory Visit: Payer: Self-pay | Admitting: Physician Assistant

## 2015-02-20 ENCOUNTER — Telehealth: Payer: Self-pay | Admitting: Interventional Cardiology

## 2015-02-20 NOTE — Telephone Encounter (Signed)
Returned pt call. Pt sts that she does not ck her bp regularly. It was checked yesterday by a nurse at work and her bp was  111/70. Pt reports that she felt unsteady on her feet at the time. Pt reports no other symptoms. Pt is symptomatic today. She sts that she is taking Lisinopril HCTZ 20-25mg  as prescribed. Adv her that the bp reading she provided who be considered a good bp reading. Pt sts that she is going to purchase a bp cuff. Adv pt to monitor her bp a couple times a week over the next 1-2 wk and call with her bp readings. She should call back sooner if symptoms develop. Pt agreeable with plan and verbalized  understanding

## 2015-02-20 NOTE — Telephone Encounter (Signed)
New Message  Pt c/o BP issue:  1. What are your last 5 BP readings? 111/70 @ 12:50p today  2. Are you having any other symptoms (ex. Dizziness, headache, blurred vision, passed out)? She doesn't feel normal, Light headed, doesn't feel like herself  3. What is your medication issue? Not sure   Comments: Pt would like to discuss why her BP is so low. Pt also states that she has a slight headache on the right from time to time. Please call back to discuss

## 2016-07-02 ENCOUNTER — Other Ambulatory Visit: Payer: Self-pay | Admitting: Obstetrics & Gynecology

## 2016-07-04 LAB — CYTOLOGY - PAP

## 2017-06-03 ENCOUNTER — Telehealth: Payer: Self-pay | Admitting: Interventional Cardiology

## 2017-06-03 NOTE — Telephone Encounter (Signed)
Spoke with Dr. Tamala Julian and he said pt would need to have PCP change medication as she has not been seen in 3 yrs by anyone in cardiology.  Left message for pt to call back.

## 2017-06-03 NOTE — Telephone Encounter (Signed)
New message  Patient calling, wants to discontinue taking Lisinopril due to publication on cancer causing ingredient. Patient requesting  Dr Tamala Julian recomment another medication. Encouraged patient to make appointment as well, she declined. Please call.  Pt c/o medication issue:  1. Name of Medication: lisinopril-hydrochlorothiazide (PRINZIDE,ZESTORETIC) 20-25 MG per tablet  2. How are you currently taking this medication (dosage and times per day)? As prescribed  3. Are you having a reaction (difficulty breathing--STAT)? NO  4. What is your medication issue? Patient no longer wants to take Lisinopril

## 2017-06-11 NOTE — Telephone Encounter (Signed)
Left message to call back  

## 2018-07-16 ENCOUNTER — Encounter: Payer: Self-pay | Admitting: Obstetrics & Gynecology

## 2018-07-16 DIAGNOSIS — N939 Abnormal uterine and vaginal bleeding, unspecified: Secondary | ICD-10-CM | POA: Diagnosis not present

## 2018-07-16 DIAGNOSIS — N950 Postmenopausal bleeding: Secondary | ICD-10-CM | POA: Diagnosis not present

## 2018-08-11 ENCOUNTER — Encounter (HOSPITAL_BASED_OUTPATIENT_CLINIC_OR_DEPARTMENT_OTHER): Admission: RE | Payer: Self-pay | Source: Home / Self Care

## 2018-08-11 ENCOUNTER — Ambulatory Visit (HOSPITAL_BASED_OUTPATIENT_CLINIC_OR_DEPARTMENT_OTHER)
Admission: RE | Admit: 2018-08-11 | Payer: Commercial Managed Care - PPO | Source: Home / Self Care | Admitting: Obstetrics

## 2018-08-11 SURGERY — DILATATION AND CURETTAGE /HYSTEROSCOPY
Anesthesia: Choice

## 2018-08-17 ENCOUNTER — Other Ambulatory Visit (HOSPITAL_COMMUNITY)
Admission: RE | Admit: 2018-08-17 | Discharge: 2018-08-17 | Disposition: A | Discharge status: Home / Self Care | Payer: Commercial Managed Care - PPO | Source: Ambulatory Visit | Attending: Obstetrics & Gynecology | Admitting: Obstetrics & Gynecology

## 2018-08-17 ENCOUNTER — Other Ambulatory Visit: Payer: Self-pay

## 2018-08-17 ENCOUNTER — Encounter: Payer: Self-pay | Admitting: Obstetrics & Gynecology

## 2018-08-17 ENCOUNTER — Ambulatory Visit: Payer: Commercial Managed Care - PPO | Admitting: Obstetrics & Gynecology

## 2018-08-17 VITALS — BP 134/84 | HR 88 | Resp 16 | Ht 65.0 in | Wt 316.6 lb

## 2018-08-17 DIAGNOSIS — Z124 Encounter for screening for malignant neoplasm of cervix: Secondary | ICD-10-CM

## 2018-08-17 DIAGNOSIS — N926 Irregular menstruation, unspecified: Secondary | ICD-10-CM | POA: Insufficient documentation

## 2018-08-17 DIAGNOSIS — Z01419 Encounter for gynecological examination (general) (routine) without abnormal findings: Secondary | ICD-10-CM

## 2018-08-17 NOTE — Progress Notes (Signed)
63 y.o. [id number] Legally Separated Black or [place] American female here for new patient exam.  Long term pt of Dr. Deatra Ina.  Still spotting about every month.  Reports Dr. Deatra Ina did test her for menopause.  Reports this was normal.  Does have PMS symptoms.    12/17 father passed.  1/18 brother passed.  Then separate from husband in 7/19.    Has been working on person weight loss.  Has lost over 50 pounds.    PCP:  Dr. Felipa Eth.  Last appt was in July, 2019  Patient's last menstrual period was 08/05/2018.          Sexually active: No.  The current method of family planning is none and abstinence.    Exercising: Yes.    starting today  Smoker:  no  Health Maintenance: Pap:  07/02/16 Neg. HR HPV:neg  History of abnormal Pap:  no MMG: 06/2017 [place].  Colonoscopy:  04/19/12 f/u 5 years.   BMD:  2011 TDaP:  2019 Pneumonia vaccine(s):  n/a Shingrix:   No Hep C testing: No Screening Labs: PCP   reports that she has never smoked. She has never used smokeless tobacco. She reports that she does not drink alcohol or use drugs.  Past Medical History:  Diagnosis Date  . Anemia   . Borderline diabetes   . Diverticulitis 12/2011  . Fibroid   . Hypertension   . Migraine   . Morbid obesity ([place])   . Peptic ulcer disease 1998   normal EGD 2004  . Sleep apnea    No CPAP use    Past Surgical History:  Procedure Laterality Date  . BLADDER SURGERY     Stretched  . COLONOSCOPY  04/19/2012   Procedure: COLONOSCOPY;  Surgeon: Juanita Craver, MD;  Location: [facility];  Service: Endoscopy;  Laterality: N/A;  . DILATION AND CURETTAGE OF UTERUS     with miscarriage  . TIBIA FRACTURE SURGERY  1992   7 pins in tibia, 2 pins in knee cap    Current Outpatient Medications  Medication Sig Dispense Refill  . lisinopril-hydrochlorothiazide (PRINZIDE,ZESTORETIC) 20-25 MG per tablet Take 1 tablet by mouth daily.    . Multiple Vitamin (MULTIVITAMIN) tablet Take 1 tablet by mouth daily.      No current facility-administered medications for this visit.     Family History  Problem Relation Age of Onset  . Hypertension Mother   . Diabetes Mellitus I Mother   . Hypertension Father   . Lupus Father   . Prostate cancer Father   . CAD Father        a. s/p CABG and valve surgery @ 34.  . Valvular heart disease Father   . Hypertension Brother   . Diabetes Mellitus I Brother   . Suicidality Neg Hx   . Alcohol abuse Neg Hx   . Anxiety disorder Neg Hx   . Bipolar disorder Neg Hx   . Depression Neg Hx   . Drug abuse Neg Hx     Review of Systems  All other systems reviewed and are negative.   Exam:   BP 134/84 (BP Location: Right Arm, Patient Position: Sitting, Cuff Size: Large)   Pulse 88   Resp 16   Ht 5\' 5"  (1.651 m)   Wt (!) 316 lb 9.6 oz (143.6 kg)   LMP 08/05/2018   BMI 52.68 kg/m   Height: 5\' 5"  (165.1 cm)  Ht Readings from Last 3 Encounters:  08/17/18 5\' 5"  (1.651  m)  06/09/14 5\' 5"  (1.651 m)  04/13/14 5\' 5"  (1.651 m)    General appearance: alert, cooperative and appears stated age Head: Normocephalic, without obvious abnormality, atraumatic Neck: no adenopathy, supple, symmetrical, trachea midline and thyroid normal to inspection and palpation Lungs: clear to auscultation bilaterally Breasts: normal appearance, no masses or tenderness Heart: regular rate and rhythm Abdomen: soft, non-tender; bowel sounds normal; no masses,  no organomegaly Extremities: extremities normal, atraumatic, no cyanosis or edema Skin: Skin color, texture, turgor normal. No rashes or lesions Lymph nodes: Cervical, supraclavicular, and axillary nodes normal. No abnormal inguinal nodes palpated Neurologic: Grossly normal   Pelvic: External genitalia:  no lesions              Urethra:  normal appearing urethra with no masses, tenderness or lesions              Bartholins and Skenes: normal                 Vagina: normal appearing vagina with normal color and discharge, no  lesions              Cervix: no lesions              Pap taken: Yes.   Bimanual Exam:  Uterus:  normal size, contour, position, consistency, mobility, non-tender              Adnexa: normal adnexa and no mass, fullness, tenderness               Rectovaginal: Confirms               Anus:  normal sphincter tone, no lesions  Endometrial biopsy recommended.  Discussed with patient.  Verbal and consent obtained.   Procedure:  Speculum placed.  Cervix visualized and cleansed with betadine prep.  A single toothed tenaculum was applied to the anterior lip of the cervix.  Endometrial pipelle was advanced through the cervix into the endometrial cavity without difficulty.  Pipelle passed to 7.5cm.  Suction applied and pipelle removed with good tissue sample obtained.  Tenculum removed.  No bleeding noted.  Patient tolerated procedure well.  Chaperone was present for exam.  A:  Well Woman with normal exam Obesity with weight loss Irregular bleeding Hypertension  P:   Mammogram guidelines reviewed.  Information given to pt about where to do next MMG Release of records from Dr. Deatra Ina signed today pap smear obtained Endometrial biopsy obtained [facility] and estradiol obtained as well.  May need PUS but will wait until results from above are finalized return annually or prn

## 2018-08-17 NOTE — Patient Instructions (Signed)

## 2018-08-18 LAB — FOLLICLE STIMULATING HORMONE: FSH: 45.6 m[IU]/mL

## 2018-08-18 LAB — ESTRADIOL: Estradiol: 33.7 pg/mL

## 2018-08-19 LAB — CYTOLOGY - PAP: Diagnosis: NEGATIVE

## 2018-08-24 ENCOUNTER — Other Ambulatory Visit: Payer: Self-pay | Admitting: *Deleted

## 2018-08-24 DIAGNOSIS — N926 Irregular menstruation, unspecified: Secondary | ICD-10-CM

## 2018-08-30 ENCOUNTER — Telehealth: Payer: Self-pay | Admitting: Obstetrics & Gynecology

## 2018-08-30 NOTE — Telephone Encounter (Signed)
Patient called to cancel sonohysterogram appointment scheduled on 09/02/2018, due to having to speak at a funeral on that day. Patient has rescheduled sonohysterogram appointment on 09/09/2018 with Dr Sabra Heck. Patient is aware of the appointment date, arrival time and cancellation policy. Patient had no further concerns.  Forwarding to Dr Sabra Heck for final review. Patient is agreeable to disposition. Will close encounter

## 2018-09-02 ENCOUNTER — Other Ambulatory Visit: Payer: Self-pay

## 2018-09-02 ENCOUNTER — Other Ambulatory Visit: Payer: Self-pay | Admitting: Obstetrics & Gynecology

## 2018-09-09 ENCOUNTER — Other Ambulatory Visit: Payer: Self-pay | Admitting: Obstetrics & Gynecology

## 2018-09-09 ENCOUNTER — Ambulatory Visit (INDEPENDENT_AMBULATORY_CARE_PROVIDER_SITE_OTHER): Payer: Commercial Managed Care - PPO | Admitting: Obstetrics & Gynecology

## 2018-09-09 ENCOUNTER — Ambulatory Visit (INDEPENDENT_AMBULATORY_CARE_PROVIDER_SITE_OTHER): Payer: Commercial Managed Care - PPO

## 2018-09-09 ENCOUNTER — Other Ambulatory Visit: Payer: Self-pay

## 2018-09-09 VITALS — BP 130/80 | HR 86 | Resp 16 | Ht 65.0 in | Wt 307.0 lb

## 2018-09-09 DIAGNOSIS — N950 Postmenopausal bleeding: Secondary | ICD-10-CM

## 2018-09-09 DIAGNOSIS — N9489 Other specified conditions associated with female genital organs and menstrual cycle: Secondary | ICD-10-CM

## 2018-09-09 DIAGNOSIS — N926 Irregular menstruation, unspecified: Secondary | ICD-10-CM

## 2018-09-09 DIAGNOSIS — D251 Intramural leiomyoma of uterus: Secondary | ICD-10-CM

## 2018-09-09 NOTE — Progress Notes (Signed)
63 y.o. [id number] Legally Separated African American female here for a pelvic ultrasound with sonohystogram due to persistent PMP bleeding.  She did have a negative endometrial biopsy.  Patient's last menstrual period was 08/05/2018.  Contraception: PMP  Technique:  Both transabdominal and transvaginal ultrasound examinations of the pelvis were performed. Transabdominal technique was performed for global imaging of the pelvis including uterus, ovaries, adnexal regions, and pelvic cul-de-sac.  It was necessary to proceed with endovaginal exam following the abdominal ultrasound transabdominal exam to visualize the endometrium and adnexa.  Color and duplex Doppler ultrasound was utilized to evaluate blood flow to the ovaries.    FINDINGS: Uterus: 7.6 x 5.7 x 5.9cm with multiple fibroids measuring 1.3cm, 1.4cm and 2.7cm Endometrium: 3.21mm Adnexa:  Left: 1.7 x 1.5 x 1.6cm     Right:  2.0 x 1.3 x 1.4cm Cul de sac: no free fluid  SHSG:  After obtaining appropriate verbal consent from patient, the cervix was visualized using a speculum, and prepped with betadine.  A tenaculum  was not applied to the cervix.  Dilation of the cervix was not necessary. The catheter was passed into the uterus and sterile saline introduced, with the following findings: 17 x 59mm likely polyp.  Due to her elevated FSH and the continued vaginal spotting/bleeding as well as the endometrial lesion noted today, hysteroscopic resection of this recommended.  Endometrial biopsy was negative so I feel this will be benign.    Procedure discussed with patient.  Recovery and pain management discussed.  Risks discussed including but not limited to bleeding, rare risk of transfusion, infection, 1% risk of uterine perforation with risks of fluid deficit causing cardiac arrythmia, cerebral swelling and/or need to stop procedure early.  Fluid emboli and rare risk of death discussed.  DVT/PE, rare risk of risk of bowel/bladder/ureteral/vascular  injury.  Patient aware if pathology abnormal she may need additional treatment.  All questions answered.    Assessment: PMP bleeding Uterine fibroids Endometrial lesion  Plan:   Will plan to proceed with hysteroscopy and resection of endometrial lesion, D&C.  ~25 minutes spent with patient >50% of time was in face to face discussion of above.

## 2018-09-12 ENCOUNTER — Encounter: Payer: Self-pay | Admitting: Obstetrics & Gynecology

## 2018-09-12 DIAGNOSIS — D259 Leiomyoma of uterus, unspecified: Secondary | ICD-10-CM | POA: Insufficient documentation

## 2018-09-30 ENCOUNTER — Encounter: Payer: Self-pay | Admitting: Obstetrics & Gynecology

## 2018-11-12 ENCOUNTER — Telehealth: Payer: Self-pay | Admitting: *Deleted

## 2018-11-12 NOTE — Telephone Encounter (Signed)
Business office called patient to review insurance benefits for procedure and advise hospital is resuming elective surgery following Covid 19. Patient declines to schedule at this time.  Reports she wants to wait until Covid improves and she will call back when ready to proceed.

## 2018-12-10 NOTE — Telephone Encounter (Signed)
Patient is ready to schedule her surgery.

## 2018-12-22 NOTE — Telephone Encounter (Signed)
Call to patient to discuss surgery date options.  Patient has some concerns about proceeding due to Covid 19 numbers. Reviewed steps taken by [facility] in surgery center and ambulatory setting. Offered office visit with Dr Sabra Heck to review findings and risks/benefits of proceeding. Patient declines office visit. Agrees to proceed but declines offered date of 01-04-19. Next available date at out-patient center is week of 01-24-19. Prefers to avoid date at main hospital.  Will schedule and call patient back.  Routing to Dr Sabra Heck to review and confirm.

## 2019-01-28 ENCOUNTER — Other Ambulatory Visit: Payer: Self-pay | Admitting: Obstetrics & Gynecology

## 2019-01-28 ENCOUNTER — Telehealth: Payer: Self-pay | Admitting: Obstetrics & Gynecology

## 2019-01-28 NOTE — Telephone Encounter (Signed)
Patient is ready to schedule her surgery.

## 2019-01-28 NOTE — Telephone Encounter (Signed)
Return call to patient. Reports she is has bleeding about once a month.  Varies light to heavy, some slots. Had bleeding this week so ready to schedule. Discussed date option of 02-15-19. Scheduled surgical consult for Monday, 01-31-19 with Dr Sabra Heck to discuss further.   Routing to Dr Sabra Heck. Encounter closed.

## 2019-01-28 NOTE — Telephone Encounter (Signed)
See next phone encounter. Patient called on 01-28-2019 to schedule surgery.

## 2019-01-31 ENCOUNTER — Telehealth: Payer: Self-pay | Admitting: *Deleted

## 2019-01-31 ENCOUNTER — Encounter: Payer: Self-pay | Admitting: Obstetrics & Gynecology

## 2019-01-31 ENCOUNTER — Other Ambulatory Visit: Payer: Self-pay

## 2019-01-31 ENCOUNTER — Ambulatory Visit (INDEPENDENT_AMBULATORY_CARE_PROVIDER_SITE_OTHER): Payer: Commercial Managed Care - PPO | Admitting: Obstetrics & Gynecology

## 2019-01-31 VITALS — BP 116/80 | HR 88 | Temp 97.5°F | Ht 65.0 in | Wt 300.0 lb

## 2019-01-31 DIAGNOSIS — N950 Postmenopausal bleeding: Secondary | ICD-10-CM

## 2019-01-31 DIAGNOSIS — N9489 Other specified conditions associated with female genital organs and menstrual cycle: Secondary | ICD-10-CM

## 2019-01-31 DIAGNOSIS — D251 Intramural leiomyoma of uterus: Secondary | ICD-10-CM

## 2019-01-31 MED ORDER — LISINOPRIL-HYDROCHLOROTHIAZIDE 20-12.5 MG PO TABS: 1.0000 | ORAL_TABLET | Freq: Every day | ORAL | Status: DC

## 2019-01-31 NOTE — Telephone Encounter (Signed)
Call from patient. Confirmed surgery date and time. Patient has scheduled consult this afternoon.  Encounter closed.

## 2019-01-31 NOTE — Telephone Encounter (Signed)
Call to patient. Left message to call back.  Calling to advise patient surgery will be at [facility] on [phone] at [phone].

## 2019-01-31 NOTE — Progress Notes (Signed)
63 y.o. [id number] Legally Separated Black or [place] American female here for discussion of upcoming procedure.  Hysteroscopy with probable polyp resection, D&C planned.  She has been evaluated with ultrasound with uterus measuring 7.6 x 5.7 x 5.9cm with three fibroids, 1.3cm, 1.4cm, and 2.7cm.  Endometrium was 3.42mm with a 17 x 85mm probable polyp.  She continues to have PMP bleeding.  Endometrial biopsy was negative for abnormal cells 08/2018.  Procedure was not scheduled due to Covid 19.  Pt was contacted after ORs reported.  At that time, pt was not able to schedule.  She is now ready to proceed.  Interval update of medical history discussed today.  Procedure discussed with patient.  Recovery and pain management discussed.  Risks discussed including but not limited to bleeding, rare risk of transfusion, infection, 1% risk of uterine perforation with risks of fluid deficit causing cardiac arrythmia, cerebral swelling and/or need to stop procedure early.  Fluid emboli and rare risk of death discussed.  DVT/PE, rare risk of risk of bowel/bladder/ureteral/vascular injury.  Patient aware if pathology abnormal she may need additional treatment.  All questions answered.    Ob Hx:   Patient's last menstrual period was 08/05/2018.          Sexually active: No. Birth control: no method Last pap: 08/17/18 neg  Last MMG: 07/30/17 BIRADS1:neg  Tobacco: No  Past Surgical History:  Procedure Laterality Date  . BLADDER SURGERY     Stretched  . COLONOSCOPY  04/19/2012   Procedure: COLONOSCOPY;  Surgeon: Juanita Craver, MD;  Location: [facility];  Service: Endoscopy;  Laterality: N/A;  . DILATION AND CURETTAGE OF UTERUS     with miscarriage  . TIBIA FRACTURE SURGERY  1992   7 pins in tibia, 2 pins in knee cap    Past Medical History:  Diagnosis Date  . Anemia   . Fibroid   . History of migraine   . Hypertension   . Morbid obesity ([place])   . Peptic ulcer disease 1998   normal EGD 2004    Allergies:  Patient has no known allergies.  Current Outpatient Medications  Medication Sig Dispense Refill  . Multiple Vitamin (MULTIVITAMIN) tablet Take 1 tablet by mouth daily.    Marland Kitchen lisinopril-hydrochlorothiazide (ZESTORETIC) 20-12.5 MG tablet Take 1 tablet by mouth daily.     No current facility-administered medications for this visit.     ROS: Pertinent items noted in HPI and remainder of comprehensive ROS otherwise negative.  Exam:    BP 116/80   Pulse 88   Temp (!) 97.5 F (36.4 C) (Temporal)   Ht 5\' 5"  (1.651 m)   Wt 300 lb (136.1 kg)   LMP 08/05/2018   BMI 49.92 kg/m   General appearance: alert and cooperative Head: Normocephalic, without obvious abnormality, atraumatic Neck: no adenopathy, supple, symmetrical, trachea midline and thyroid not enlarged, symmetric, no tenderness/mass/nodules Lungs: clear to auscultation bilaterally Heart: regular rate and rhythm, S1, S2 normal, no murmur, click, rub or gallop Abdomen: soft, non-tender; bowel sounds normal; no masses,  no organomegaly Extremities: extremities normal, atraumatic, no cyanosis or edema Skin: Skin color, texture, turgor normal. No rashes or lesions Lymph nodes: Cervical, supraclavicular, and axillary nodes normal. no inguinal nodes palpated Neurologic: Grossly normal  Pelvic: External genitalia:  no lesions              Urethra: normal appearing urethra with no masses, tenderness or lesions  Bartholins and Skenes: Bartholin's, Urethra, Skene's normal                 Vagina: normal appearing vagina with normal color and discharge, no lesions              Cervix: normal appearance              Pap taken: No.        Bimanual Exam:  Uterus:  About 8 weeks, globular                                      Adnexa:    normal adnexa in size, nontender and no masses                                      Rectovaginal: Deferred  A: Postmenopausal bleeding  Fibroid uterus PMP bleeding Endometrial mass    P:   Hysteroscopy with possible polyp resection, D&C planned Post op pain medications discussed Pre and post op instructions given.  ~20 minutes spent with patient >50% of time was in face to face discussion of above.

## 2019-02-01 ENCOUNTER — Telehealth: Payer: Self-pay | Admitting: Obstetrics & Gynecology

## 2019-02-01 NOTE — Telephone Encounter (Signed)
Surgical case canceled at hospital.   Routing to Dr Sabra Heck. Encounter closed.

## 2019-02-01 NOTE — Telephone Encounter (Signed)
Call placed to patient to advise, during pre-certification process, we were advised healthcare coverage terminated on 01/28/2019. Patient confirmed coverage has terminated, and new plan will not be effective until 03/01/2019. Patient states she will need to cancel surgery, until new insurance is effective. Patient advises she will call our office when she has new insurance information.   Forwarding to Lamont Snowball, RN

## 2019-02-09 ENCOUNTER — Other Ambulatory Visit (HOSPITAL_COMMUNITY): Payer: Commercial Managed Care - PPO

## 2019-02-11 ENCOUNTER — Other Ambulatory Visit (HOSPITAL_COMMUNITY): Payer: Self-pay

## 2019-02-15 ENCOUNTER — Ambulatory Visit: Admit: 2019-02-15 | Payer: Commercial Managed Care - PPO | Admitting: Obstetrics & Gynecology

## 2019-02-15 SURGERY — DILATATION & CURETTAGE/HYSTEROSCOPY WITH MYOSURE
Anesthesia: Choice

## 2019-03-02 ENCOUNTER — Ambulatory Visit: Payer: Commercial Managed Care - PPO | Admitting: Obstetrics & Gynecology

## 2019-03-10 ENCOUNTER — Telehealth: Payer: Self-pay | Admitting: Obstetrics & Gynecology

## 2019-03-10 NOTE — Telephone Encounter (Signed)
Patient called with updated insurance and ready for surgery pre-certification. Jacqueline Mcclure

## 2019-03-10 NOTE — Telephone Encounter (Signed)
Returned call to patient and reviewed benefit for surgical procedure, under new insurance plan. Patient states she does not feel like benefit information is correct. Patient to contact insurance company and call back to office to advise how she would like to proceed.   cc: Lamont Snowball, RN

## 2019-03-21 NOTE — Telephone Encounter (Signed)
Patient made surgery prepayment and is ready to proceed with surgery.

## 2019-03-25 NOTE — Telephone Encounter (Signed)
Patient is calling regarding scheduling surgery. °

## 2019-03-25 NOTE — Telephone Encounter (Signed)
Return call to patient. Surgery date rescheduled to 04-18-19 at [facility]. Surgery instruction sheet reviewed and will provide printed copy at consult on 03-31-19 along with Covid restrictions.   Routed to Dr Sabra Heck. Encounter closed.

## 2019-03-27 ENCOUNTER — Other Ambulatory Visit: Payer: Self-pay | Admitting: Obstetrics & Gynecology

## 2019-03-29 ENCOUNTER — Other Ambulatory Visit: Payer: Self-pay

## 2019-03-31 ENCOUNTER — Encounter: Payer: Self-pay | Admitting: Obstetrics & Gynecology

## 2019-03-31 ENCOUNTER — Other Ambulatory Visit: Payer: Self-pay | Admitting: Obstetrics & Gynecology

## 2019-03-31 ENCOUNTER — Other Ambulatory Visit: Payer: Self-pay

## 2019-03-31 ENCOUNTER — Ambulatory Visit (INDEPENDENT_AMBULATORY_CARE_PROVIDER_SITE_OTHER): Payer: BC Managed Care – PPO | Admitting: Obstetrics & Gynecology

## 2019-03-31 VITALS — BP 130/84 | HR 72 | Temp 97.3°F | Ht 66.0 in | Wt 312.6 lb

## 2019-03-31 DIAGNOSIS — N950 Postmenopausal bleeding: Secondary | ICD-10-CM

## 2019-03-31 DIAGNOSIS — N840 Polyp of corpus uteri: Secondary | ICD-10-CM

## 2019-03-31 NOTE — Progress Notes (Signed)
GYNECOLOGY  VISIT  CC:   Surgery Consult   HPI: 63 y.o. [id number] Legally Separated Black or [place] American female here for surgery consult.  Pt has hx of recurrent PMP bleeding with ultrasound showing an endometrial polyp.  Endometrial biopsy was negative in February.  Surgery was planned in late March but never scheduled due to Covid and the OR change in scheduling elective procedures.  Once the ORs opened in May, pt declined to schedule.  She ultimately decided to proceed in August but she had a change in insurance.  She is now here for interval update in her history as well as discuss again procedure and repeat endometrial biopsy due to being >6 months since prior one.  Pt has gained weight since last visit.  I living with her mother waiting for divorce to be finalized.  This has created a lot of stressors.  Otherwise, she has no new medical issues and no new surgeries.  Procedure discussed with patient.  Recovery and pain management discussed.  Risks discussed including but not limited to bleeding, rare risk of transfusion, infection, 1% risk of uterine perforation with risks of fluid deficit causing cardiac arrythmia, cerebral swelling and/or need to stop procedure early.  Fluid emboli and rare risk of death discussed.  DVT/PE, rare risk of risk of bowel/bladder/ureteral/vascular injury.  Patient aware if pathology abnormal she may need additional treatment.  All questions answered.    GYNECOLOGIC HISTORY: Patient's last menstrual period was 08/05/2018. Contraception: post menopausal  Menopausal hormone therapy: none  Patient Active Problem List   Diagnosis Date Noted  . Uterine leiomyoma 09/12/2018  . Obesity   . HTN (hypertension) 04/11/2014    Past Medical History:  Diagnosis Date  . Anemia   . Fibroid   . History of migraine   . Hypertension   . Morbid obesity ([place])   . Peptic ulcer disease 1998   normal EGD 2004    Past Surgical History:  Procedure Laterality Date  .  BLADDER SURGERY     Stretched  . COLONOSCOPY  04/19/2012   Procedure: COLONOSCOPY;  Surgeon: Juanita Craver, MD;  Location: [facility];  Service: Endoscopy;  Laterality: N/A;  . DILATION AND CURETTAGE OF UTERUS     with miscarriage  . TIBIA FRACTURE SURGERY  1992   7 pins in tibia, 2 pins in knee cap    MEDS:   Current Outpatient Medications on File Prior to Visit  Medication Sig Dispense Refill  . lisinopril-hydrochlorothiazide (ZESTORETIC) 20-12.5 MG tablet Take 1 tablet by mouth daily.    . Multiple Vitamin (MULTIVITAMIN) tablet Take 1 tablet by mouth daily.     No current facility-administered medications on file prior to visit.     ALLERGIES: Patient has no known allergies.  Family History  Problem Relation Age of Onset  . Hypertension Mother   . Diabetes Mellitus I Mother   . Hypertension Father   . Lupus Father   . Prostate cancer Father   . CAD Father        a. s/p CABG and valve surgery @ 22.  . Valvular heart disease Father   . Hypertension Brother   . Diabetes Mellitus I Brother   . Suicidality Neg Hx   . Alcohol abuse Neg Hx   . Anxiety disorder Neg Hx   . Bipolar disorder Neg Hx   . Depression Neg Hx   . Drug abuse Neg Hx     SH:  Separated, non smoker  Review of Systems  Genitourinary: Positive for vaginal bleeding.  All other systems reviewed and are negative.   PHYSICAL EXAMINATION:   Vitals:   03/31/19 1504  BP: 130/84  Pulse: 72  Temp: (!) 97.3 F (36.3 C)  SpO2: 98%   LMP 08/05/2018      General appearance: alert, cooperative and appears stated age Neck: no adenopathy, supple, symmetrical, trachea midline and thyroid normal to inspection and palpation CV:  Regular rate and rhythm Lungs:  clear to auscultation, no wheezes, rales or rhonchi, symmetric air entry Abdomen: soft, non-tender; bowel sounds normal; no masses,  no organomegaly Lymph:  no inguinal LAD noted  Pelvic: External genitalia:  no lesions              Urethra:  normal  appearing urethra with no masses, tenderness or lesions              Bartholins and Skenes: normal                 Vagina: normal appearing vagina with normal color and discharge, no lesions              Cervix: no lesions              Bimanual Exam:  Uterus:  normal size, contour, position, consistency, mobility, non-tender              Adnexa: no mass, fullness, tenderness              Rectovaginal: No.              Anus:  normal sphincter tone, no lesions  Endometrial biopsy recommended.  Discussed with patient.  Verbal and written consent obtained.    Procedure:  Speculum placed.  Cervix visualized and cleansed with betadine prep.  A single toothed tenaculum was applied to the anterior lip of the cervix.  Endometrial pipelle was advanced through the cervix into the endometrial cavity without difficulty.  Pipelle passed to 7cm.  Suction applied and pipelle removed with good tissue sample obtained.  Tenculum removed.  No bleeding noted.  Patient tolerated procedure well.  Chaperone was present for exam.  Assessment: PMP bleeding Endometrial polyp Hypertension Obesity with BMI >50 Uterine fibroids  Plan: Endometrial biopsy pending.  Hysteroscopy with polyp resection, D&C is planned.  Pt is aware I'd really like to help her get this completed and I hope we do not have to reschedule for any reasons.  Pt is in agreement.

## 2019-04-03 ENCOUNTER — Encounter: Payer: Self-pay | Admitting: Obstetrics & Gynecology

## 2019-04-12 NOTE — Pre-Procedure Instructions (Signed)
Jacqueline Mcclure  04/12/2019     Your procedure is scheduled on Monday, October 19..  Report to [facility], Main Entrance or Entrance "A" at 8:50  A.M.   Call this number if you have problems the morning of surgery: For any other questions, please call [phone], Monday - Friday 8 AM - 4 PM.   For any other questions, please call [phone], Monday - Friday 8 AM - 4 PM.   Do not eat or drink after midnight Sunday, October 18.              Take these medicines the morning of surgery with A SIP OF WATER: If needed: Tylenol.   1 Week prior to surgery STOP taking Aspirin, Aspirin Products (Goody Powder, Excedrin Migraine), Ibuprofen (Advil), Naproxen (Aleve), Vitamins and Herbal Products (ie Fish Oil).  The Morning of surgery  Do not wear jewelry, make-up or nail polish.  Do not wear lotions, powders, or perfumes, or deodorant.  Do not shave 48 hours prior to surgery.    Do not bring valuables to the hospital.  [facility] is not responsible for any belongings or valuables.  Contacts, dentures or bridgework may not be worn into surgery.  Leave your suitcase in the car.  After surgery it may be brought to your room.  For patients admitted to the hospital, discharge time will be determined by your treatment team.  Patients discharged the day of surgery will not be allowed to drive home.   Special instructions:   [place]- Preparing For Surgery  Before surgery, you can play an important role. Because skin is not sterile, your skin needs to be as free of germs as possible. You can reduce the number of germs on your skin by washing with CHG (chlorahexidine gluconate) Soap before surgery.  CHG is an antiseptic cleaner which kills germs and bonds with the skin to continue killing germs even after washing.    Oral Hygiene is also important to reduce your risk of infection.  Remember - BRUSH YOUR TEETH THE MORNING OF SURGERY WITH YOUR REGULAR TOOTHPASTE  Please do not  use if you have an allergy to CHG or antibacterial soaps. If your skin becomes reddened/irritated stop using the CHG.  Do not shave (including legs and underarms) for at least 48 hours prior to first CHG shower. It is OK to shave your face.  Please follow these instructions carefully.   1. Shower the NIGHT BEFORE SURGERY and the MORNING OF SURGERY with CHG.   2. If you chose to wash your hair, wash your hair first as usual with your normal shampoo.  3. After you shampoo, rinse your hair and body thoroughly to remove the shampoo.  4. Use CHG as you would any other liquid soap. You can apply CHG directly to the skin and wash gently with a scrungie or a clean washcloth.   5. Apply the CHG Soap to your body ONLY FROM THE NECK DOWN.  Do not use on open wounds or open sores. Avoid contact with your eyes, ears, mouth and genitals (private parts). Wash Face and genitals (private parts)  with your normal soap.  6. Wash thoroughly, paying special attention to the area where your surgery will be performed.  7. Thoroughly rinse your body with warm water from the neck down.  8. DO NOT shower/wash with your normal soap after using and rinsing off the CHG Soap.  9. Pat yourself dry with a CLEAN TOWEL.  10. Wear CLEAN PAJAMAS to bed the night before surgery, wear comfortable clothes the morning of surgery  11. Place CLEAN SHEETS on your bed the night of your first shower and DO NOT SLEEP WITH PETS.  Day of Surgery: Shower as instructed above. Do not apply any deodorants/lotions.  Please wear clean clothes to the hospital/surgery center.   Remember to brush your teeth WITH YOUR REGULAR TOOTHPASTE.  Please read over the following fact sheets that you were given.  If you are a smoker, DO NOT Smoke within 24 hours of surgery.  If you have use a CPAP , please bring the mask with you.

## 2019-04-13 ENCOUNTER — Encounter (HOSPITAL_COMMUNITY): Payer: Self-pay

## 2019-04-13 ENCOUNTER — Other Ambulatory Visit: Payer: Self-pay | Admitting: Obstetrics & Gynecology

## 2019-04-13 ENCOUNTER — Encounter (HOSPITAL_COMMUNITY)
Admission: RE | Admit: 2019-04-13 | Discharge: 2019-04-13 | Disposition: A | Discharge status: Home / Self Care | Payer: BC Managed Care – PPO | Source: Ambulatory Visit | Attending: Obstetrics & Gynecology | Admitting: Obstetrics & Gynecology

## 2019-04-13 ENCOUNTER — Other Ambulatory Visit: Payer: Self-pay

## 2019-04-13 DIAGNOSIS — Z01818 Encounter for other preprocedural examination: Secondary | ICD-10-CM | POA: Diagnosis not present

## 2019-04-13 LAB — BASIC METABOLIC PANEL
Anion gap: 12 (ref 5–15)
BUN: 17 mg/dL (ref 6–20)
CO2: 26 mmol/L (ref 22–32)
Calcium: 9.8 mg/dL (ref 8.9–10.3)
Chloride: 100 mmol/L (ref 98–111)
Creatinine, Ser: 1.13 mg/dL — ABNORMAL HIGH (ref 0.44–1.00)
GFR calc Af Amer: >60 mL/min (ref >60)
GFR calc non Af Amer: 53 mL/min — ABNORMAL LOW (ref >60)
Glucose, Bld: 96 mg/dL (ref 70–99)
Potassium: 3.8 mmol/L (ref 3.5–5.1)
Sodium: 138 mmol/L (ref 135–145)

## 2019-04-13 LAB — CBC
HCT: 41.3 % (ref 36.0–46.0)
Hemoglobin: 13.3 g/dL (ref 12.0–15.0)
MCH: 27.1 pg (ref 26.0–34.0)
MCHC: 32.2 g/dL (ref 30.0–36.0)
MCV: 84.3 fL (ref 80.0–100.0)
Platelets: 287 10*3/uL (ref 150–400)
RBC: 4.9 MIL/uL (ref 3.87–5.11)
RDW: 14 % (ref 11.5–15.5)
WBC: 6.9 10*3/uL (ref 4.0–10.5)
nRBC: 0 % (ref 0.0–0.2)

## 2019-04-13 NOTE — Progress Notes (Signed)
PCP -  Hal Stoneking Cardiologist - na  PPM/ICD - na Device Orders -  Rep Notified -   Chest x-ray - na EKG - today Stress Test - na ECHO - na Cardiac Cath -  na  Sleep Study - 2015-pt. Reports it was required for weight loss surgery.She didn't go thru  For the surgery. CPAP -   Fasting Blood Sugar -  na Checks Blood Sugar _____ times a day  Blood Thinner Instructions:na Aspirin Instructions:  ERAS Protcol -na PRE-SURGERY Ensure or G2-   COVID TEST-  04/14/19 Anesthesia review:   Patient denies shortness of breath, fever, cough and chest pain at PAT appointment   All instructions explained to the patient, with a verbal understanding of the material. Patient agrees to go over the instructions while at home for a better understanding. Patient also instructed to self quarantine after being tested for COVID-19. The opportunity to ask questions was provided.

## 2019-04-14 ENCOUNTER — Other Ambulatory Visit (HOSPITAL_COMMUNITY)
Admission: RE | Admit: 2019-04-14 | Discharge: 2019-04-14 | Disposition: A | Discharge status: Home / Self Care | Payer: BC Managed Care – PPO | Source: Ambulatory Visit | Attending: Obstetrics & Gynecology | Admitting: Obstetrics & Gynecology

## 2019-04-14 DIAGNOSIS — Z20828 Contact with and (suspected) exposure to other viral communicable diseases: Secondary | ICD-10-CM | POA: Insufficient documentation

## 2019-04-14 DIAGNOSIS — Z01812 Encounter for preprocedural laboratory examination: Secondary | ICD-10-CM | POA: Insufficient documentation

## 2019-04-16 LAB — NOVEL CORONAVIRUS, NAA (HOSP ORDER, SEND-OUT TO REF LAB; TAT 18-24 HRS): SARS-CoV-2, NAA: NOT DETECTED

## 2019-04-18 ENCOUNTER — Ambulatory Visit (HOSPITAL_COMMUNITY): Payer: BC Managed Care – PPO | Admitting: Certified Registered Nurse Anesthetist

## 2019-04-18 ENCOUNTER — Encounter (HOSPITAL_COMMUNITY): Payer: Self-pay | Admitting: Certified Registered Nurse Anesthetist

## 2019-04-18 ENCOUNTER — Ambulatory Visit (HOSPITAL_COMMUNITY)
Admission: RE | Admit: 2019-04-18 | Discharge: 2019-04-18 | Disposition: A | Discharge status: Home / Self Care | Payer: BC Managed Care – PPO | Attending: Obstetrics & Gynecology | Admitting: Obstetrics & Gynecology

## 2019-04-18 ENCOUNTER — Other Ambulatory Visit: Payer: Self-pay

## 2019-04-18 ENCOUNTER — Encounter (HOSPITAL_COMMUNITY)
Admission: RE | Disposition: A | Discharge status: Home / Self Care | Payer: Self-pay | Source: Home / Self Care | Attending: Obstetrics & Gynecology

## 2019-04-18 DIAGNOSIS — Z8489 Family history of other specified conditions: Secondary | ICD-10-CM | POA: Insufficient documentation

## 2019-04-18 DIAGNOSIS — Z8042 Family history of malignant neoplasm of prostate: Secondary | ICD-10-CM | POA: Insufficient documentation

## 2019-04-18 DIAGNOSIS — Z8711 Personal history of peptic ulcer disease: Secondary | ICD-10-CM | POA: Diagnosis not present

## 2019-04-18 DIAGNOSIS — Z833 Family history of diabetes mellitus: Secondary | ICD-10-CM | POA: Diagnosis not present

## 2019-04-18 DIAGNOSIS — N840 Polyp of corpus uteri: Secondary | ICD-10-CM | POA: Diagnosis not present

## 2019-04-18 DIAGNOSIS — N950 Postmenopausal bleeding: Secondary | ICD-10-CM | POA: Insufficient documentation

## 2019-04-18 DIAGNOSIS — I10 Essential (primary) hypertension: Secondary | ICD-10-CM | POA: Insufficient documentation

## 2019-04-18 DIAGNOSIS — Z79899 Other long term (current) drug therapy: Secondary | ICD-10-CM | POA: Diagnosis not present

## 2019-04-18 DIAGNOSIS — Z8249 Family history of ischemic heart disease and other diseases of the circulatory system: Secondary | ICD-10-CM | POA: Diagnosis not present

## 2019-04-18 DIAGNOSIS — Z6841 Body Mass Index (BMI) 40.0 and over, adult: Secondary | ICD-10-CM | POA: Insufficient documentation

## 2019-04-18 DIAGNOSIS — G43909 Migraine, unspecified, not intractable, without status migrainosus: Secondary | ICD-10-CM | POA: Diagnosis not present

## 2019-04-18 DIAGNOSIS — D251 Intramural leiomyoma of uterus: Secondary | ICD-10-CM | POA: Diagnosis not present

## 2019-04-18 HISTORY — PX: DILATATION & CURETTAGE/HYSTEROSCOPY WITH MYOSURE: SHX6511

## 2019-04-18 SURGERY — DILATATION & CURETTAGE/HYSTEROSCOPY WITH MYOSURE
Anesthesia: General | Site: Vagina

## 2019-04-18 MED ORDER — OXYCODONE HCL 5 MG/5ML PO SOLN: 5.0000 mg | Freq: Once | ORAL | Status: DC | PRN

## 2019-04-18 MED ORDER — DEXAMETHASONE SODIUM PHOSPHATE 10 MG/ML IJ SOLN
INTRAMUSCULAR | Status: DC | PRN
  Administered 2019-04-18: 10 mg via INTRAVENOUS

## 2019-04-18 MED ORDER — PROPOFOL 10 MG/ML IV BOLUS
INTRAVENOUS | Status: AC
  Filled 2019-04-18: qty 20

## 2019-04-18 MED ORDER — DEXAMETHASONE SODIUM PHOSPHATE 10 MG/ML IJ SOLN
INTRAMUSCULAR | Status: AC
  Filled 2019-04-18: qty 1

## 2019-04-18 MED ORDER — ONDANSETRON HCL 4 MG/2ML IJ SOLN
INTRAMUSCULAR | Status: AC
  Filled 2019-04-18: qty 2

## 2019-04-18 MED ORDER — FENTANYL CITRATE (PF) 100 MCG/2ML IJ SOLN: 25.0000 ug | INTRAMUSCULAR | Status: DC | PRN

## 2019-04-18 MED ORDER — MIDAZOLAM HCL 2 MG/2ML IJ SOLN
INTRAMUSCULAR | Status: DC | PRN
  Administered 2019-04-18: 2 mg via INTRAVENOUS

## 2019-04-18 MED ORDER — MIDAZOLAM HCL 2 MG/2ML IJ SOLN
INTRAMUSCULAR | Status: AC
  Filled 2019-04-18: qty 2

## 2019-04-18 MED ORDER — MEPERIDINE HCL 25 MG/ML IJ SOLN: 6.2500 mg | INTRAMUSCULAR | Status: DC | PRN

## 2019-04-18 MED ORDER — IBUPROFEN 800 MG PO TABS: 800.0000 mg | ORAL_TABLET | Freq: Three times a day (TID) | ORAL | 0 refills | Status: DC | PRN

## 2019-04-18 MED ORDER — SUCCINYLCHOLINE CHLORIDE 200 MG/10ML IV SOSY
PREFILLED_SYRINGE | INTRAVENOUS | Status: AC
  Filled 2019-04-18: qty 10

## 2019-04-18 MED ORDER — ACETAMINOPHEN 160 MG/5ML PO SOLN: 325.0000 mg | ORAL | Status: DC | PRN

## 2019-04-18 MED ORDER — LIDOCAINE-EPINEPHRINE 1 %-1:100000 IJ SOLN
INTRAMUSCULAR | Status: AC
  Filled 2019-04-18: qty 1

## 2019-04-18 MED ORDER — ROCURONIUM BROMIDE 10 MG/ML (PF) SYRINGE
PREFILLED_SYRINGE | INTRAVENOUS | Status: AC
  Filled 2019-04-18: qty 20

## 2019-04-18 MED ORDER — ONDANSETRON HCL 4 MG/2ML IJ SOLN
INTRAMUSCULAR | Status: DC | PRN
  Administered 2019-04-18: 4 mg via INTRAVENOUS

## 2019-04-18 MED ORDER — KETOROLAC TROMETHAMINE 30 MG/ML IJ SOLN: 30.0000 mg | Freq: Once | INTRAMUSCULAR | Status: DC | PRN

## 2019-04-18 MED ORDER — PHENYLEPHRINE 40 MCG/ML (10ML) SYRINGE FOR IV PUSH (FOR BLOOD PRESSURE SUPPORT)
PREFILLED_SYRINGE | INTRAVENOUS | Status: DC | PRN
  Administered 2019-04-18: 80 ug via INTRAVENOUS

## 2019-04-18 MED ORDER — PROPOFOL 10 MG/ML IV BOLUS
INTRAVENOUS | Status: DC | PRN
  Administered 2019-04-18: 200 mg via INTRAVENOUS

## 2019-04-18 MED ORDER — SODIUM CHLORIDE 0.9 % IR SOLN
Status: DC | PRN
  Administered 2019-04-18: 3000 mL

## 2019-04-18 MED ORDER — HYDROCODONE-ACETAMINOPHEN 5-325 MG PO TABS: 1.0000 | ORAL_TABLET | Freq: Four times a day (QID) | ORAL | 0 refills | Status: DC | PRN

## 2019-04-18 MED ORDER — LIDOCAINE HCL (CARDIAC) PF 100 MG/5ML IV SOSY
PREFILLED_SYRINGE | INTRAVENOUS | Status: DC | PRN
  Administered 2019-04-18: 100 mg via INTRAVENOUS

## 2019-04-18 MED ORDER — ONDANSETRON HCL 4 MG/2ML IJ SOLN: 4.0000 mg | Freq: Once | INTRAMUSCULAR | Status: DC | PRN

## 2019-04-18 MED ORDER — PHENYLEPHRINE 40 MCG/ML (10ML) SYRINGE FOR IV PUSH (FOR BLOOD PRESSURE SUPPORT)
PREFILLED_SYRINGE | INTRAVENOUS | Status: AC
  Filled 2019-04-18: qty 30

## 2019-04-18 MED ORDER — LIDOCAINE-EPINEPHRINE 1 %-1:100000 IJ SOLN
INTRAMUSCULAR | Status: DC | PRN
  Administered 2019-04-18: 10 mL

## 2019-04-18 MED ORDER — LACTATED RINGERS IV SOLN
INTRAVENOUS | Status: DC
  Administered 2019-04-18: 10:00:00 via INTRAVENOUS

## 2019-04-18 MED ORDER — FENTANYL CITRATE (PF) 250 MCG/5ML IJ SOLN
INTRAMUSCULAR | Status: AC
  Filled 2019-04-18: qty 5

## 2019-04-18 MED ORDER — FENTANYL CITRATE (PF) 250 MCG/5ML IJ SOLN
INTRAMUSCULAR | Status: DC | PRN
  Administered 2019-04-18: 100 ug via INTRAVENOUS
  Administered 2019-04-18: 50 ug via INTRAVENOUS

## 2019-04-18 MED ORDER — SILVER NITRATE-POT NITRATE 75-25 % EX MISC
CUTANEOUS | Status: AC
  Filled 2019-04-18: qty 1

## 2019-04-18 MED ORDER — LIDOCAINE 2% (20 MG/ML) 5 ML SYRINGE
INTRAMUSCULAR | Status: AC
  Filled 2019-04-18: qty 10

## 2019-04-18 MED ORDER — ACETAMINOPHEN 325 MG PO TABS: 325.0000 mg | ORAL_TABLET | ORAL | Status: DC | PRN

## 2019-04-18 MED ORDER — OXYCODONE HCL 5 MG PO TABS: 5.0000 mg | ORAL_TABLET | Freq: Once | ORAL | Status: DC | PRN

## 2019-04-18 SURGICAL SUPPLY — 17 items
CANISTER SUCT 3000ML PPV (MISCELLANEOUS) ×2 IMPLANT
CATH ROBINSON RED A/P 16FR (CATHETERS) ×2 IMPLANT
DEVICE MYOSURE LITE (MISCELLANEOUS) IMPLANT
DEVICE MYOSURE REACH (MISCELLANEOUS) IMPLANT
DILATOR CANAL MILEX (MISCELLANEOUS) ×1 IMPLANT
GLOVE BIOGEL PI IND STRL 7.0 (GLOVE) ×2 IMPLANT
GLOVE BIOGEL PI INDICATOR 7.0 (GLOVE) ×2
GLOVE ECLIPSE 6.5 STRL STRAW (GLOVE) ×2 IMPLANT
GOWN STRL REUS W/ TWL LRG LVL3 (GOWN DISPOSABLE) ×2 IMPLANT
GOWN STRL REUS W/TWL LRG LVL3 (GOWN DISPOSABLE) ×4
KIT PROCEDURE FLUENT (KITS) ×2 IMPLANT
KIT TURNOVER KIT B (KITS) ×2 IMPLANT
PACK VAGINAL MINOR WOMEN LF (CUSTOM PROCEDURE TRAY) ×2 IMPLANT
PAD OB MATERNITY 4.3X12.25 (PERSONAL CARE ITEMS) ×2 IMPLANT
SEAL ROD LENS SCOPE MYOSURE (ABLATOR) ×2 IMPLANT
TOWEL GREEN STERILE FF (TOWEL DISPOSABLE) ×4 IMPLANT
UNDERPAD 30X30 (UNDERPADS AND DIAPERS) ×2 IMPLANT

## 2019-04-18 NOTE — Discharge Instructions (Signed)
Post-surgical Instructions, Outpatient Surgery  You may expect to feel dizzy, weak, and drowsy for as long as 24 hours after receiving the medicine that made you sleep (anesthetic). For the first 24 hours after your surgery:    Do not drive a car, ride a bicycle, participate in physical activities, or take public transportation until you are done taking narcotic pain medicines or as directed by Dr. Sabra Heck.   Do not drink alcohol or take tranquilizers.   Do not take medicine that has not been prescribed by your physicians.   Do not sign important papers or make important decisions while on narcotic pain medicines.   Have a responsible person with you.   PAIN MANAGEMENT  Motrin 800mg .  (This is the same as 4-200mg  over the counter tablets of Motrin or ibuprofen.)  You may take this every eight hours or as needed for cramping.  You can alternate taking Motrin and Tylenol if you need.    Vicodin 5/325mg .  For more severe pain, take one or two tablets every four to six hours as needed for pain control.  (Remember that narcotic pain medications increase your risk of constipation.  If this becomes a problem, you may take an over the counter stool softener like Colace 100mg  up to four times a day.)  If you take the Vicodin, do not take extra Tylenol as this medication has tylenol combined with the pain medication.  DO'S AND DON'T'S  Do not take a tub bath for one week.  You may shower on the first day after your surgery  Do not do any heavy lifting for one to two weeks.  This increases the chance of bleeding.  Do move around as you feel able.  Stairs are fine.  You may begin to exercise again as you feel able.  Do not lift any weights for two weeks.  Do not put anything in the vagina for two weeks--no tampons, intercourse, or douching.    REGULAR MEDIATIONS/VITAMINS:  You may restart all of your regular medications as prescribed.  You may restart all of your vitamins as you normally take  them.    PLEASE CALL OR SEEK MEDICAL CARE IF:  You have persistent nausea and vomiting.   You have trouble eating or drinking.   You have an oral temperature above 100.5.   You have constipation that is not helped by adjusting diet or increasing fluid intake. Pain medicines are a common cause of constipation.   You have heavy vaginal bleeding

## 2019-04-18 NOTE — Anesthesia Postprocedure Evaluation (Signed)
Anesthesia Post Note  Patient: Jacqueline Mcclure  Procedure(s) Performed: DILATATION & CURETTAGE/HYSTEROSCOPY WITH MYOSURE POLYP RESECTION (N/A Vagina )     Patient location during evaluation: Phase II Anesthesia Type: General Level of consciousness: awake Pain management: pain level controlled Vital Signs Assessment: post-procedure vital signs reviewed and stable Respiratory status: spontaneous breathing Cardiovascular status: stable Postop Assessment: no apparent nausea or vomiting Anesthetic complications: no    Last Vitals:  Vitals:   04/18/19 1051 04/18/19 1106  BP: (!) 143/88 (!) 141/84  Pulse: (!) 101 78  Resp: 14 18  Temp: 36.4 C   SpO2: 100% 98%    Last Pain:  Vitals:   04/18/19 1121  PainSc: Asleep   Pain Goal: Patients Stated Pain Goal: 5 (04/18/19 0922)                 Huston Foley

## 2019-04-18 NOTE — Anesthesia Procedure Notes (Signed)
Procedure Name: LMA Insertion Date/Time: 04/18/2019 10:21 AM Performed by: Larene Beach, CRNA Pre-anesthesia Checklist: Patient identified, Emergency Drugs available, Suction available and Patient being monitored Patient Re-evaluated:Patient Re-evaluated prior to induction Oxygen Delivery Method: Circle system utilized Preoxygenation: Pre-oxygenation with 100% oxygen Induction Type: IV induction Ventilation: Mask ventilation without difficulty LMA: LMA inserted LMA Size: 4.0 Number of attempts: 1 Placement Confirmation: positive ETCO2,  breath sounds checked- equal and bilateral and CO2 detector Tube secured with: Tape Dental Injury: Teeth and Oropharynx as per pre-operative assessment

## 2019-04-18 NOTE — Op Note (Signed)
04/18/2019  10:41 AM  PATIENT:  Jacqueline Mcclure  63 y.o. female  PRE-OPERATIVE DIAGNOSIS:  POST MENOPAUSAL BLEEDING,, endometrial polyp  POST-OPERATIVE DIAGNOSIS:  POST MENOPAUSAL BLEEDING, endometrial polyp  PROCEDURE:  Procedure(s): DILATATION & CURETTAGE/HYSTEROSCOPY WITH MYOSURE POLYP RESECTION  SURGEON:  Megan Salon  ASSISTANTS: OR staff   ANESTHESIA:   general, LMA  ESTIMATED BLOOD LOSS: 10 mL  BLOOD ADMINISTERED:none   FLUIDS: 700cc LR  UOP: 50 cc clear UOP drained with I&O cath at beginning of procedure  SPECIMEN:  Endometrial polyps and endometrial curettings  DISPOSITION OF SPECIMEN:  PATHOLOGY  FINDINGS: two endometrial polyps  DESCRIPTION OF OPERATION: Patient was taken to the operating room.  She is placed in the supine position. SCDs were on her lower extremities and functioning properly. General anesthesia with an LMA was administered without difficulty. Dr. Jillyn Hidden, anesthesia, oversaw case.  Legs were then placed in the [place] in the low lithotomy position. The legs were lifted to the high lithotomy position and the Betadine prep was used on the inner thighs perineum and vagina x3. Patient was draped in a normal standard fashion. An in and out catheterization with a red rubber Foley catheter was performed. Approximately 50 cc of clear urine was noted. A bivalve speculum was placed the vagina. The anterior lip of the cervix was grasped with single-tooth tenaculum.  A paracervical block of 1% lidocaine mixed one-to-one with epinephrine (1:100,000 units).  10 cc was used total. The cervix is dilated up to #[facility] dilators. The endometrial cavity sounded to 8 cm.   A 2.9 millimeter diagnostic hysteroscope was obtained. Normal saline was used as a hysteroscopic fluid. The hysteroscope was advanced through the endocervical canal into the endometrial cavity. The tubal ostia were noted bilaterally. Additional findings included two polyps.  Using the  Myosure lite device with the hysteroscope, the polyps were fully removed without difficulty.  The hysteroscope was removed. A #1 toothed curette was used to curette the cavity until rough gritty texture was noted in all quadrants. With revisualization of the hysteroscope, there was no longer any abnormal findings.  A second curetting was then performed.  At this point no other procedure was needed and this procedure was ended. The hysteroscope was removed. The fluid deficit was 160 cc. The tenaculum was removed from the anterior lip of the cervix. The speculum was removed from the vagina. The prep was cleansed of the patient's skin. The legs are positioned back in the supine position. Sponge, lap, needle, initially counts were correct x2. Patient was taken to recovery in stable condition.  COUNTS:  YES  PLAN OF CARE: Transfer to PACU

## 2019-04-18 NOTE — H&P (Signed)
Jacqueline Mcclure is an 63 y.o. female  [phone] legally separated BF here for hysteroscopy with polyp resection, D&C due to PMP bleeding and endometrial polyp that was noted on ultrasound performed 09/09/2018.  Uterus measured 7.6 x 5.7 x 5.9cm with multiple fibroids measuring 1.3cm, 1.4cm and 2.7cm.  Endometrium was 3.2 with polyp measuring 17 x 55mm polyp.    Procedure has been scheduled and rescheduled due to Covid and then changes in insurance.  Risks and benefits have been discussed.  She knows there is really no alternative to this procedure due to the persistent PMP bleeding.  Pertinent Gynecological History: Menses: post-menopausal Bleeding: post menopausal bleeding Contraception: post menopausal status DES exposure: denies Blood transfusions: none Sexually transmitted diseases: D&C with miscarriage Previous GYN Procedures: none  Last mammogram: normal Date: 07/30/2017 Last pap: normal Date: 08/17/2018 OB History: G3, P1   Menstrual History: Patient's last menstrual period was 08/05/2018 (lmp unknown).    Past Medical History:  Diagnosis Date  . Fibroid   . History of migraine   . Hypertension   . Morbid obesity ([place])   . Peptic ulcer disease 1998   normal EGD 2004    Past Surgical History:  Procedure Laterality Date  . BLADDER SURGERY     Stretched  . COLONOSCOPY  04/19/2012   Procedure: COLONOSCOPY;  Surgeon: Juanita Craver, MD;  Location: [facility];  Service: Endoscopy;  Laterality: N/A;  . DILATION AND CURETTAGE OF UTERUS     with miscarriage  . TIBIA FRACTURE SURGERY  1992   7 pins in tibia, 2 pins in knee cap    Family History  Problem Relation Age of Onset  . Hypertension Mother   . Diabetes Mellitus I Mother   . Hypertension Father   . Lupus Father   . Prostate cancer Father   . CAD Father        a. s/p CABG and valve surgery @ 43.  . Valvular heart disease Father   . Hypertension Brother   . Diabetes Mellitus I Brother   . Suicidality Neg Hx   .  Alcohol abuse Neg Hx   . Anxiety disorder Neg Hx   . Bipolar disorder Neg Hx   . Depression Neg Hx   . Drug abuse Neg Hx     Social History:  reports that she has never smoked. She has never used smokeless tobacco. She reports that she does not drink alcohol or use drugs.  Allergies: No Known Allergies  Medications Prior to Admission  Medication Sig Dispense Refill Last Dose  . acetaminophen (TYLENOL) 500 MG tablet Take 1,000 mg by mouth every 6 (six) hours as needed for mild pain or headache.   Past Week at Unknown time  . BIOTIN PO Take 500 mg by mouth daily.   Past Week at Unknown time  . Garlic [id number] MG CAPS Take 1,000 mg by mouth daily.   Past Week at Unknown time  . lisinopril-hydrochlorothiazide (ZESTORETIC) 20-12.5 MG tablet Take 1 tablet by mouth daily.   04/17/2019 at Unknown time  . Multiple Vitamin (MULTIVITAMIN) tablet Take 1 tablet by mouth daily.   Past Week at Unknown time  . vitamin C (ASCORBIC ACID) 500 MG tablet Take 500 mg by mouth daily.   Past Week at Unknown time    Review of Systems  All other systems reviewed and are negative.   Blood pressure 132/71, pulse 83, temperature 97.9 F (36.6 C), resp. rate 20, last menstrual period 08/05/2018, SpO2 100 %. Physical  Exam  Constitutional: She is oriented to person, place, and time. She appears well-developed and well-nourished.  Cardiovascular: Normal rate and regular rhythm.  Respiratory: Effort normal and breath sounds normal.  Neurological: She is alert and oriented to person, place, and time.  Skin: Skin is warm and dry.  Psychiatric: She has a normal mood and affect.    No results found for this or any previous visit (from the past 24 hour(s)).  No results found.  Assessment/Plan: 63 yo G3P1 separated AAF here for hysteroscopy with polyp resection, dilation and curettage due to persistent PMP bleeding.  Questions answered.  Pt ready to proceed.    Megan Salon 04/18/2019, 9:51 AM

## 2019-04-18 NOTE — Transfer of Care (Signed)
Immediate Anesthesia Transfer of Care Note  Patient: Jacqueline Mcclure  Procedure(s) Performed: DILATATION & CURETTAGE/HYSTEROSCOPY WITH MYOSURE POLYP RESECTION (N/A Vagina )  Patient Location: PACU  Anesthesia Type:General  Level of Consciousness: drowsy and patient cooperative  Airway & Oxygen Therapy: Patient Spontanous Breathing  Post-op Assessment: Report given to RN, Post -op Vital signs reviewed and stable and Patient moving all extremities X 4  Post vital signs: Reviewed and stable  Last Vitals:  Vitals Value Taken Time  BP 143/88 04/18/19 1051  Temp    Pulse 95 04/18/19 1053  Resp 16 04/18/19 1053  SpO2 98 % 04/18/19 1053  Vitals shown include unvalidated device data.  Last Pain:  Vitals:   04/18/19 1051  PainSc: (P) 0-No pain      Patients Stated Pain Goal: 5 ([id number])  Complications: No apparent anesthesia complications

## 2019-04-18 NOTE — Anesthesia Preprocedure Evaluation (Addendum)
Anesthesia Evaluation  Patient identified by MRN, date of birth, ID band Patient awake    Reviewed: Allergy & Precautions, NPO status   Airway Mallampati: III       Dental no notable dental hx. (+) Teeth Intact   Pulmonary neg pulmonary ROS,    Pulmonary exam normal breath sounds clear to auscultation       Cardiovascular hypertension, Pt. on medications Normal cardiovascular exam Rhythm:Regular Rate:Normal     Neuro/Psych negative neurological ROS  negative psych ROS   GI/Hepatic Neg liver ROS,   Endo/Other  Morbid obesity  Renal/GU negative Renal ROS  negative genitourinary   Musculoskeletal negative musculoskeletal ROS (+)   Abdominal (+) + obese,   Peds  Hematology negative hematology ROS (+)   Anesthesia Other Findings   Reproductive/Obstetrics                             Anesthesia Physical Anesthesia Plan  ASA: III  Anesthesia Plan: General   Post-op Pain Management:    Induction: Intravenous  PONV Risk Score and Plan: 4 or greater and Ondansetron, Dexamethasone and Midazolam  Airway Management Planned: LMA  Additional Equipment: None  Intra-op Plan:   Post-operative Plan:   Informed Consent: I have reviewed the patients History and Physical, chart, labs and discussed the procedure including the risks, benefits and alternatives for the proposed anesthesia with the patient or authorized representative who has indicated his/her understanding and acceptance.     Dental advisory given  Plan Discussed with: CRNA  Anesthesia Plan Comments:         Anesthesia Quick Evaluation

## 2019-04-19 ENCOUNTER — Encounter (HOSPITAL_COMMUNITY): Payer: Self-pay | Admitting: Obstetrics & Gynecology

## 2019-04-19 LAB — SURGICAL PATHOLOGY

## 2019-04-25 ENCOUNTER — Other Ambulatory Visit: Payer: Self-pay | Admitting: Obstetrics & Gynecology

## 2019-05-03 ENCOUNTER — Ambulatory Visit (INDEPENDENT_AMBULATORY_CARE_PROVIDER_SITE_OTHER): Payer: BC Managed Care – PPO | Admitting: Obstetrics & Gynecology

## 2019-05-03 ENCOUNTER — Other Ambulatory Visit: Payer: Self-pay

## 2019-05-03 ENCOUNTER — Encounter: Payer: Self-pay | Admitting: Obstetrics & Gynecology

## 2019-05-03 VITALS — BP 122/80 | HR 80 | Temp 97.3°F | Resp 12 | Ht 65.0 in | Wt 304.0 lb

## 2019-05-03 DIAGNOSIS — N840 Polyp of corpus uteri: Secondary | ICD-10-CM | POA: Diagnosis not present

## 2019-05-03 NOTE — Progress Notes (Signed)
Post Operative Visit  Procedure:DILATATION & CURETTAGE/HYSTEROSCOPY WITH MYOSURE POLYP RESECTION (N/A Vagina ) Days Post-op: 2 weeks  Subjective: Doing well.  She did have a little light spotting after the surgery.  She hasn't had any bleeding for over a week.  Reports vaginal and urinary odor has resolved.  Having no pain on her right side when   Objective: BP 122/80 (BP Location: Left Arm, Patient Position: Sitting, Cuff Size: Large)   Pulse 80   Temp (!) 97.3 F (36.3 C) (Temporal)   Resp 12   Ht 5\' 5"  (1.651 m)   Wt (!) 304 lb (137.9 kg)   LMP 08/05/2018 (LMP Unknown)   BMI 50.59 kg/m   EXAM General: alert and no distress Resp: clear to auscultation bilaterally Cardio: regular rate and rhythm, S1, S2 normal, no murmur, click, rub or gallop GI: soft, non-tender; bowel sounds normal; no masses,  no organomegaly Extremities: extremities normal, atraumatic, no cyanosis or edema Vaginal Bleeding: none  Gyn:  NAEFG, vagina pink and moist, no lesion, cervix closed, no CMT  Assessment: s/p hysteroscopic polyp resection with D&C.  Pathology showed benign endometrial polyps  Plan: Recheck at [facility] 12/2019.  Pt knows to call with any new bleeding, Information about screening MMG given.

## 2020-01-09 ENCOUNTER — Other Ambulatory Visit: Payer: Self-pay | Admitting: Physical Medicine and Rehabilitation

## 2020-01-09 ENCOUNTER — Other Ambulatory Visit: Payer: Self-pay | Admitting: Obstetrics & Gynecology

## 2020-01-09 DIAGNOSIS — Z1231 Encounter for screening mammogram for malignant neoplasm of breast: Secondary | ICD-10-CM

## 2020-01-09 NOTE — Progress Notes (Signed)
64 y.o. [id number] Divorced Black or [place] American female here for annual exam.  Denies vaginal bleeding.  Had hysteroscopy with polyp resection in November.  Having some issues with skin dermatitis that is caused by specific shampoos she was using.  Now using dove sensitive soap.    Has lost almost 50 pounds.  Working out twice weekly with a Clinical research associate.  Eating lots of vegetables.    Did complete her Covid vaccination.    Patient's last menstrual period was 08/05/2018 (lmp unknown).          Sexually active: No.  The current method of family planning is post menopausal status.    Exercising: Yes.    weights Smoker:  no  Health Maintenance: Pap:  07-02-16 neg HPV HR neg, 08-17-2018 neg History of abnormal Pap:  no MMG:  07-30-17 category a density birads 1:neg, scheduled for 01-18-2020 Colonoscopy:  04-19-12 f/u 10 yrs per patient BMD:   2011 TDaP:  2014 Pneumonia vaccine(s):  Not done Shingrix:   Declines vaccination Hep C testing: not done Screening Labs: Dr. Felipa Eth.  Last appt was Friday.  Cholesterol is elevated.  Reports HbA1C was normal--5.7.     reports that she has never smoked. She has never used smokeless tobacco. She reports that she does not drink alcohol and does not use drugs.  Past Medical History:  Diagnosis Date  . Fibroid   . History of migraine   . Hypertension   . Morbid obesity ([place])   . Peptic ulcer disease 1998   normal EGD 2004    Past Surgical History:  Procedure Laterality Date  . BLADDER SURGERY     Stretched  . COLONOSCOPY  04/19/2012   Procedure: COLONOSCOPY;  Surgeon: Juanita Craver, MD;  [facility];  Service: Endoscopy;  Laterality: N/A;  . DILATATION & CURETTAGE/HYSTEROSCOPY WITH MYOSURE N/A 04/18/2019   Procedure: DILATATION & CURETTAGE/HYSTEROSCOPY WITH MYOSURE POLYP RESECTION;  Surgeon: Megan Salon, MD;  Location: [facility];  Service: Gynecology;  Laterality: N/A;  . DILATION AND CURETTAGE OF UTERUS     with miscarriage  . TIBIA  FRACTURE SURGERY  1992   7 pins in tibia, 2 pins in knee cap    Current Outpatient Medications  Medication Sig Dispense Refill  . acetaminophen (TYLENOL) 500 MG tablet Take 1,000 mg by mouth every 6 (six) hours as needed for mild pain or headache.    . cholecalciferol (VITAMIN D) 25 MCG (1000 UT) tablet Take 1,000 Units by mouth daily. D3    . Garlic 1751 MG CAPS Take 1,000 mg by mouth daily.    Marland Kitchen ibuprofen (ADVIL) 800 MG tablet Take 1 tablet (800 mg total) by mouth every 8 (eight) hours as needed. 30 tablet 0  . lisinopril (ZESTRIL) 10 MG tablet Take 10 mg by mouth daily.    . Multiple Vitamin (MULTIVITAMIN) tablet Take 1 tablet by mouth daily.    . vitamin C (ASCORBIC ACID) 500 MG tablet Take 500 mg by mouth daily.     No current facility-administered medications for this visit.    Family History  Problem Relation Age of Onset  . Hypertension Mother   . Diabetes Mellitus I Mother   . Hypertension Father   . Lupus Father   . Prostate cancer Father   . CAD Father        a. s/p CABG and valve surgery @ 65.  . Valvular heart disease Father   . Hypertension Brother   . Diabetes Mellitus I Brother   .  Suicidality Neg Hx   . Alcohol abuse Neg Hx   . Anxiety disorder Neg Hx   . Bipolar disorder Neg Hx   . Depression Neg Hx   . Drug abuse Neg Hx     Review of Systems  Constitutional: Negative.   HENT: Negative.   Eyes: Negative.   Respiratory: Negative.   Cardiovascular: Negative.   Gastrointestinal: Negative.   Endocrine: Negative.   Genitourinary: Negative.   Musculoskeletal: Negative.   Skin: Negative.   Allergic/Immunologic: Negative.   Neurological: Negative.   Hematological: Negative.   Psychiatric/Behavioral: Negative.     Exam:   BP 106/70   Pulse 68   Resp 16   Ht 5' 4.75" (1.645 m)   Wt 272 lb (123.4 kg)   LMP 08/05/2018 (LMP Unknown)   BMI 45.61 kg/m   Height: 5' 4.75" (164.5 cm)  General appearance: alert, cooperative and appears stated age Head:  Normocephalic, without obvious abnormality, atraumatic Neck: no adenopathy, supple, symmetrical, trachea midline and thyroid normal to inspection and palpation Lungs: clear to auscultation bilaterally Breasts: normal appearance, no masses or tenderness Heart: regular rate and rhythm Abdomen: soft, non-tender; bowel sounds normal; no masses,  no organomegaly Extremities: extremities normal, atraumatic, no cyanosis or edema Skin: Skin color, texture, turgor normal. No rashes or lesions Lymph nodes: Cervical, supraclavicular, and axillary nodes normal. No abnormal inguinal nodes palpated Neurologic: Grossly normal   Pelvic: External genitalia:  no lesions              Urethra:  normal appearing urethra with no masses, tenderness or lesions              Bartholins and Skenes: normal                 Vagina: normal appearing vagina with normal color and discharge, no lesions              Cervix: no lesions              Pap taken: No. Bimanual Exam:  Uterus:  normal size, contour, position, consistency, mobility, non-tender              Adnexa: normal adnexa and no mass, fullness, tenderness               Rectovaginal: Confirms               Anus:  normal sphincter tone, no lesions  Chaperone, Royal Hawthorn, CMA, was present for exam.  A:  Well Woman with normal exam Obesity with purposeful weight loss Hypertension H/o hysteroscopy with polyp resection Elevated lipids  P:   Mammogram guidelines reviewed pap smear neg 2020, neg HR HPV 2018.  Not obtained today. Colonoscopy due 2023 Plan BMD around age 75 Lab work just done with Dr. Felipa Eth Return annually or prn

## 2020-01-10 ENCOUNTER — Encounter: Payer: Self-pay | Admitting: Obstetrics & Gynecology

## 2020-01-10 ENCOUNTER — Other Ambulatory Visit: Payer: Self-pay

## 2020-01-10 ENCOUNTER — Ambulatory Visit (INDEPENDENT_AMBULATORY_CARE_PROVIDER_SITE_OTHER): Payer: 59 | Admitting: Obstetrics & Gynecology

## 2020-01-10 VITALS — BP 106/70 | HR 68 | Resp 16 | Ht 64.75 in | Wt 272.0 lb

## 2020-01-10 DIAGNOSIS — Z01419 Encounter for gynecological examination (general) (routine) without abnormal findings: Secondary | ICD-10-CM

## 2020-01-18 ENCOUNTER — Ambulatory Visit
Admission: RE | Admit: 2020-01-18 | Discharge: 2020-01-18 | Disposition: A | Discharge status: Home / Self Care | Payer: 59 | Source: Ambulatory Visit | Attending: Physical Medicine and Rehabilitation | Admitting: Physical Medicine and Rehabilitation

## 2020-01-18 ENCOUNTER — Other Ambulatory Visit: Payer: Self-pay

## 2020-01-18 DIAGNOSIS — Z1231 Encounter for screening mammogram for malignant neoplasm of breast: Secondary | ICD-10-CM

## 2020-03-08 ENCOUNTER — Ambulatory Visit (INDEPENDENT_AMBULATORY_CARE_PROVIDER_SITE_OTHER): Payer: 59 | Admitting: Otolaryngology

## 2020-03-08 ENCOUNTER — Other Ambulatory Visit: Payer: Self-pay

## 2020-03-08 ENCOUNTER — Encounter (INDEPENDENT_AMBULATORY_CARE_PROVIDER_SITE_OTHER): Payer: Self-pay | Admitting: Otolaryngology

## 2020-03-08 VITALS — Temp 97.2°F

## 2020-03-08 DIAGNOSIS — J310 Chronic rhinitis: Secondary | ICD-10-CM | POA: Diagnosis not present

## 2020-03-08 NOTE — Progress Notes (Signed)
HPI: Jacqueline Mcclure is a 64 y.o. female who presents for evaluation of her sinuses.  She has previously seen Dr. Ernesto Rutherford.  A week or 2 ago she was having a lot of pressure and headaches that cause her blood pressure to go up.  She was given a prescription for amoxicillin and symptoms are doing much better.  She also uses a Nettie pot intermittently for sinus problems which she has had in the past.  She has had no yellow-green discharge from her nose.  Past Medical History:  Diagnosis Date  . Fibroid   . History of migraine   . Hypertension   . Morbid obesity ([place])   . Peptic ulcer disease 1998   normal EGD 2004   Past Surgical History:  Procedure Laterality Date  . BLADDER SURGERY     Stretched  . COLONOSCOPY  04/19/2012   Procedure: COLONOSCOPY;  Surgeon: Juanita Craver, MD;  [facility];  Service: Endoscopy;  Laterality: N/A;  . DILATATION & CURETTAGE/HYSTEROSCOPY WITH MYOSURE N/A 04/18/2019   Procedure: DILATATION & CURETTAGE/HYSTEROSCOPY WITH MYOSURE POLYP RESECTION;  Surgeon: Megan Salon, MD;  Location: [facility];  Service: Gynecology;  Laterality: N/A;  . DILATION AND CURETTAGE OF UTERUS     with miscarriage  . TIBIA FRACTURE SURGERY  1992   7 pins in tibia, 2 pins in knee cap   Social History   Socioeconomic History  . Marital status: Divorced    Spouse name: Not on file  . Number of children: Not on file  . Years of education: Not on file  . Highest education level: Not on file  Occupational History  . Not on file  Tobacco Use  . Smoking status: Never Smoker  . Smokeless tobacco: Never Used  Vaping Use  . Vaping Use: Never used  Substance and Sexual Activity  . Alcohol use: No  . Drug use: No  . Sexual activity: Not Currently    Birth control/protection: Post-menopausal  Other Topics Concern  . Not on file  Social History Narrative  . Not on file   Social Determinants of Health   Financial Resource Strain:   . Difficulty of Paying Living  Expenses: Not on file  Food Insecurity:   . Worried About Charity fundraiser in the Last Year: Not on file  . Ran Out of Food in the Last Year: Not on file  Transportation Needs:   . Lack of Transportation (Medical): Not on file  . Lack of Transportation (Non-Medical): Not on file  Physical Activity:   . Days of Exercise per Week: Not on file  . Minutes of Exercise per Session: Not on file  Stress:   . Feeling of Stress : Not on file  Social Connections:   . Frequency of Communication with Friends and Family: Not on file  . Frequency of Social Gatherings with Friends and Family: Not on file  . Attends Religious Services: Not on file  . Active Member of Clubs or Organizations: Not on file  . Attends Archivist Meetings: Not on file  . Marital Status: Not on file   Family History  Problem Relation Age of Onset  . Hypertension Mother   . Diabetes Mellitus I Mother   . Hypertension Father   . Lupus Father   . Prostate cancer Father   . CAD Father        a. s/p CABG and valve surgery @ 68.  . Valvular heart disease Father   .  Hypertension Brother   . Diabetes Mellitus I Brother   . Suicidality Neg Hx   . Alcohol abuse Neg Hx   . Anxiety disorder Neg Hx   . Bipolar disorder Neg Hx   . Depression Neg Hx   . Drug abuse Neg Hx    No Known Allergies Prior to Admission medications   Medication Sig Start Date End Date Taking? Authorizing Provider  acetaminophen (TYLENOL) 500 MG tablet Take 1,000 mg by mouth every 6 (six) hours as needed for mild pain or headache.   Yes [provider]  cholecalciferol (VITAMIN D) 25 MCG (1000 UT) tablet Take 1,000 Units by mouth daily. D3   Yes [provider]  Garlic 1607 MG CAPS Take 1,000 mg by mouth daily.   Yes [provider]  ibuprofen (ADVIL) 800 MG tablet Take 1 tablet (800 mg total) by mouth every 8 (eight) hours as needed. 04/18/19  Yes Megan Salon, MD  lisinopril (ZESTRIL) 10 MG tablet Take 10 mg  by mouth daily. 01/06/20  Yes [provider]  Multiple Vitamin (MULTIVITAMIN) tablet Take 1 tablet by mouth daily.   Yes [provider]  vitamin C (ASCORBIC ACID) 500 MG tablet Take 500 mg by mouth daily.   Yes [provider]     Positive ROS: Otherwise negative  All other systems have been reviewed and were otherwise negative with the exception of those mentioned in the HPI and as above.  Physical Exam: Constitutional: Alert, well-appearing, no acute distress Ears: External ears without lesions or tenderness. Ear canals are clear bilaterally with intact, TMs are clear bilaterally. Nasal: External nose without lesions. Septum is deviated to the left.  After decongesting the nose both nasal passages are clear in both the middle meatus regions are clear.  No signs of infection.  No polyps.. Clear nasal passages otherwise. Oral: Lips and gums without lesions. Tongue and palate mucosa without lesions. Posterior oropharynx clear. Neck: No palpable adenopathy or masses Respiratory: Breathing comfortably  Skin: No facial/neck lesions or rash noted.  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  She will continue use the Nettie pot as needed. She will follow-up as needed.  Radene Journey, MD

## 2021-02-22 ENCOUNTER — Ambulatory Visit: Payer: 59

## 2021-03-28 ENCOUNTER — Other Ambulatory Visit: Payer: Self-pay | Admitting: Obstetrics & Gynecology

## 2021-03-28 DIAGNOSIS — Z1231 Encounter for screening mammogram for malignant neoplasm of breast: Secondary | ICD-10-CM

## 2021-04-01 ENCOUNTER — Other Ambulatory Visit: Payer: Self-pay

## 2021-04-01 ENCOUNTER — Ambulatory Visit
Admission: RE | Admit: 2021-04-01 | Discharge: 2021-04-01 | Disposition: A | Discharge status: Home / Self Care | Payer: 59 | Source: Ambulatory Visit | Attending: Obstetrics & Gynecology | Admitting: Obstetrics & Gynecology

## 2021-04-01 DIAGNOSIS — Z1231 Encounter for screening mammogram for malignant neoplasm of breast: Secondary | ICD-10-CM

## 2021-04-11 ENCOUNTER — Ambulatory Visit (INDEPENDENT_AMBULATORY_CARE_PROVIDER_SITE_OTHER): Payer: 59 | Admitting: Obstetrics & Gynecology

## 2021-04-11 ENCOUNTER — Encounter (HOSPITAL_BASED_OUTPATIENT_CLINIC_OR_DEPARTMENT_OTHER): Payer: Self-pay | Admitting: Obstetrics & Gynecology

## 2021-04-11 ENCOUNTER — Other Ambulatory Visit (HOSPITAL_COMMUNITY)
Admission: RE | Admit: 2021-04-11 | Discharge: 2021-04-11 | Disposition: A | Discharge status: Home / Self Care | Payer: 59 | Source: Ambulatory Visit | Attending: Obstetrics & Gynecology | Admitting: Obstetrics & Gynecology

## 2021-04-11 ENCOUNTER — Other Ambulatory Visit: Payer: Self-pay

## 2021-04-11 VITALS — BP 109/68 | HR 78 | Ht 65.0 in | Wt 297.0 lb

## 2021-04-11 DIAGNOSIS — Z124 Encounter for screening for malignant neoplasm of cervix: Secondary | ICD-10-CM | POA: Diagnosis not present

## 2021-04-11 DIAGNOSIS — N898 Other specified noninflammatory disorders of vagina: Secondary | ICD-10-CM | POA: Insufficient documentation

## 2021-04-11 DIAGNOSIS — R300 Dysuria: Secondary | ICD-10-CM

## 2021-04-11 LAB — POCT URINALYSIS DIPSTICK
Appearance: NORMAL
Bilirubin, UA: NEGATIVE
Blood, UA: NEGATIVE
Glucose, UA: NEGATIVE
Ketones, UA: NEGATIVE
Leukocytes, UA: NEGATIVE
Nitrite, UA: NEGATIVE
Protein, UA: NEGATIVE
Spec Grav, UA: 1.02 (ref 1.010–1.025)
Urobilinogen, UA: 0.2 E.U./dL
pH, UA: 5 (ref 5.0–8.0)

## 2021-04-11 NOTE — Progress Notes (Signed)
GYNECOLOGY  VISIT  CC:   vaginal discharge  HPI: 65 y.o. [id number] Divorced Black or [place] American female here for complaint of some mild discharge.  Denies vaginal bleeding since she had a hysteroscopy with polyp resection.  This was done 04/17/2020.    Is seeing someone newly. Not SA but is considering.  No partner in several years.  Wants to make sure "everything is ok".    She denies dysuria but she is having some irritation at times.  She does use a lotion on the vulvar skin.  GYNECOLOGIC HISTORY: Patient's last menstrual period was 08/05/2018 (lmp unknown).   Patient Active Problem List   Diagnosis Date Noted   Uterine leiomyoma 09/12/2018   Obesity    HTN (hypertension) 04/11/2014    Past Medical History:  Diagnosis Date   Fibroid    History of migraine    Hypertension    Morbid obesity ([place])    Peptic ulcer disease 1998   normal EGD 2004    Past Surgical History:  Procedure Laterality Date   BLADDER SURGERY     Stretched   COLONOSCOPY  04/19/2012   Procedure: COLONOSCOPY;  Surgeon: Juanita Craver, MD;  [facility];  Service: Endoscopy;  Laterality: N/A;   DILATATION & CURETTAGE/HYSTEROSCOPY WITH MYOSURE N/A 04/18/2019   Procedure: DILATATION & CURETTAGE/HYSTEROSCOPY WITH MYOSURE POLYP RESECTION;  Surgeon: Megan Salon, MD;  Location: [facility];  Service: Gynecology;  Laterality: N/A;   DILATION AND CURETTAGE OF UTERUS     with miscarriage   TIBIA FRACTURE SURGERY  1992   7 pins in tibia, 2 pins in knee cap    MEDS:   Current Outpatient Medications on File Prior to Visit  Medication Sig Dispense Refill   acetaminophen (TYLENOL) 500 MG tablet Take 1,000 mg by mouth every 6 (six) hours as needed for mild pain or headache.     cholecalciferol (VITAMIN D) 25 MCG (1000 UT) tablet Take 1,000 Units by mouth daily. D3     Garlic 6789 MG CAPS Take 1,000 mg by mouth daily.     ibuprofen (ADVIL) 800 MG tablet Take 1 tablet (800 mg total) by mouth every 8 (eight)  hours as needed. 30 tablet 0   lisinopril-hydrochlorothiazide (ZESTORETIC) 10-12.5 MG tablet Take 1 tablet by mouth daily.     Multiple Vitamin (MULTIVITAMIN) tablet Take 1 tablet by mouth daily.     vitamin C (ASCORBIC ACID) 500 MG tablet Take 500 mg by mouth daily.     No current facility-administered medications on file prior to visit.    ALLERGIES: Patient has no known allergies.  Family History  Problem Relation Age of Onset   Hypertension Mother    Diabetes Mellitus I Mother    Hypertension Father    Lupus Father    Prostate cancer Father    CAD Father        a. s/p CABG and valve surgery @ 7.   Valvular heart disease Father    Hypertension Brother    Diabetes Mellitus I Brother    Suicidality Neg Hx    Alcohol abuse Neg Hx    Anxiety disorder Neg Hx    Bipolar disorder Neg Hx    Depression Neg Hx    Drug abuse Neg Hx     SH:  divorced, non smoker  Review of Systems  Constitutional: Negative.   Genitourinary:  Positive for vaginal discharge.   PHYSICAL EXAMINATION:    BP 109/68   Pulse 78  Ht 5\' 5"  (1.651 m)   Wt 297 lb (134.7 kg)   LMP 08/05/2018 (LMP Unknown)   BMI 49.42 kg/m     General appearance: alert, cooperative and appears stated age Lymph:  no inguinal LAD noted  Pelvic: External genitalia:  no lesions              Urethra:  normal appearing urethra with no masses, tenderness or lesions              Bartholins and Skenes: normal                 Vagina: normal appearing vagina with normal color, whitish discharge present, no lesions              Cervix: no lesions              Bimanual Exam:  Uterus:  normal size, contour, position, consistency, mobility, non-tender              Adnexa: no mass, fullness, tenderness  Chaperone, Ezekiel Ina, RN, was present for exam.  Assessment/Plan: 1. Cervical cancer screening - pap is due so obtained today - Cytology - PAP( [place])  2. Vaginal discharge - Cervicovaginal ancillary only( [facility])  3. Dysuria - Urine Culture - POCT urinalysis dipstick

## 2021-04-12 ENCOUNTER — Telehealth: Payer: Self-pay | Admitting: Obstetrics and Gynecology

## 2021-04-12 LAB — CERVICOVAGINAL ANCILLARY ONLY
Bacterial Vaginitis (gardnerella): POSITIVE — AB
Candida Glabrata: NEGATIVE
Candida Vaginitis: NEGATIVE
Comment: NEGATIVE
Comment: NEGATIVE
Comment: NEGATIVE

## 2021-04-12 MED ORDER — METRONIDAZOLE 500 MG PO TABS: 500.0000 mg | ORAL_TABLET | Freq: Two times a day (BID) | ORAL | 0 refills | Status: DC

## 2021-04-12 NOTE — Telephone Encounter (Signed)
After hours phone call from patient regarding test results she received.   She tested positive for BV.  She had some burning sensation every once and a while with urination which prompted her visit to Dr. Sabra Heck. No real odor or discharge.   We discussed BV and treatment options of Metrogel versus Flagyl.   She prefers Flagyl 500 mg po bid x 7 days. Rx sent to her pharmacy.  ETOH precautions reviewed.

## 2021-04-13 LAB — URINE CULTURE

## 2021-04-15 LAB — CYTOLOGY - PAP
Adequacy: ABSENT
Comment: NEGATIVE
Diagnosis: NEGATIVE
High risk HPV: NEGATIVE

## 2021-06-13 ENCOUNTER — Ambulatory Visit (HOSPITAL_BASED_OUTPATIENT_CLINIC_OR_DEPARTMENT_OTHER): Payer: 59 | Admitting: Obstetrics & Gynecology

## 2021-09-07 IMAGING — MG DIGITAL SCREENING BILAT W/ CAD
8 of 13 series · 8 of 13 positions shown · non-contrast
Comparison: Previous exam(s).

CLINICAL DATA: Screening.

EXAM:
DIGITAL SCREENING BILATERAL MAMMOGRAM WITH CAD

[R MLO (1 of 2)]
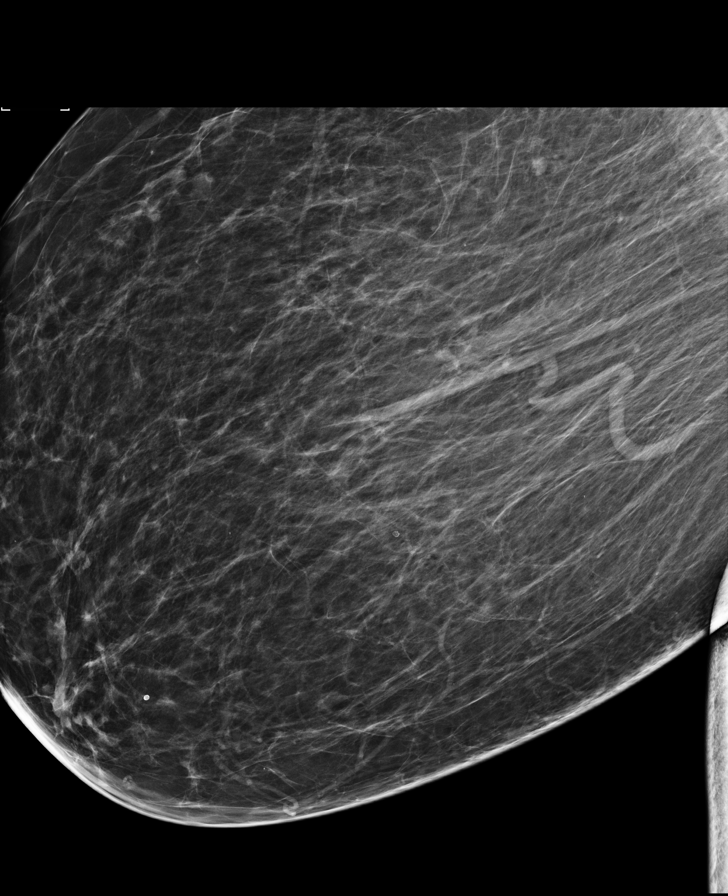

[R CC (1 of 2)]
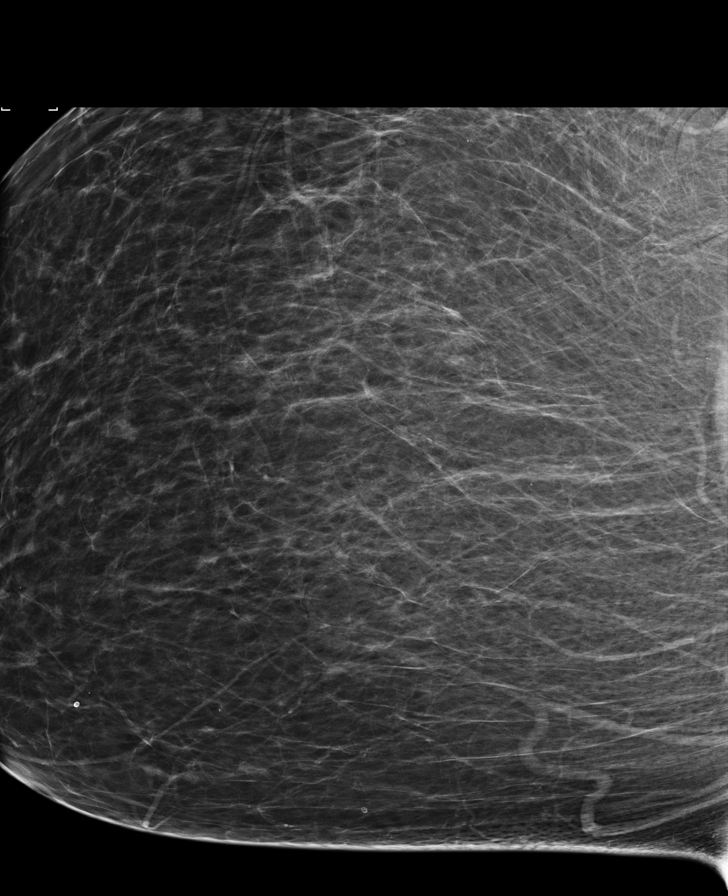

[R CC (2 of 2)]
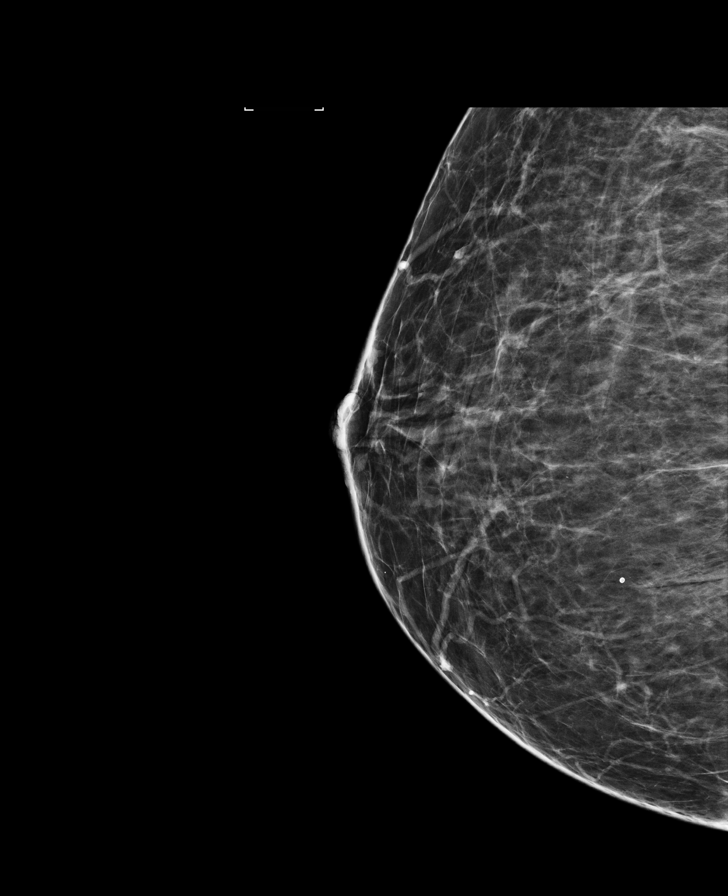

[L MLO (1 of 2)]
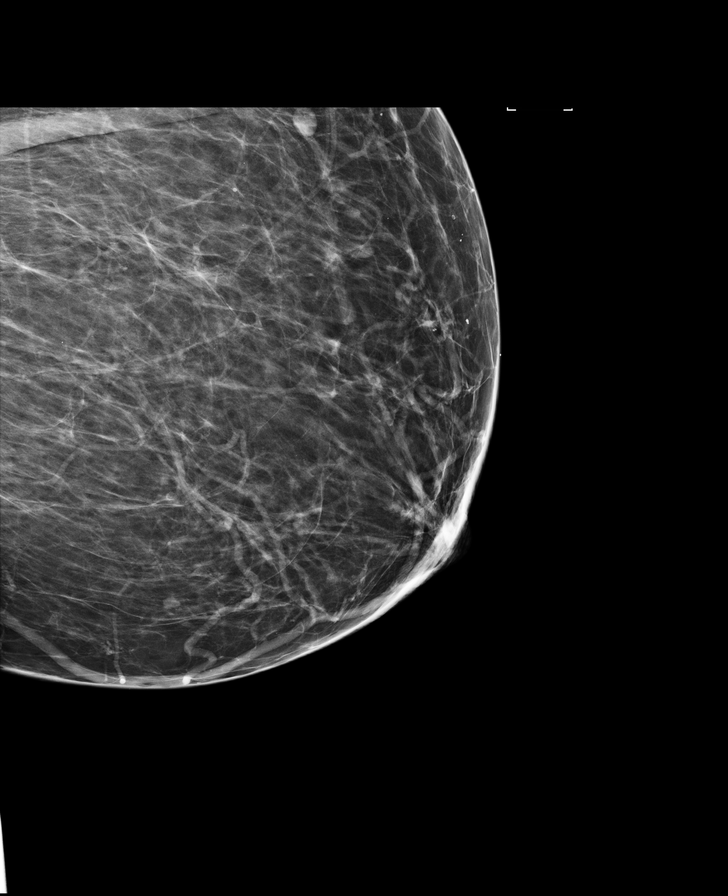

[R XCCL]
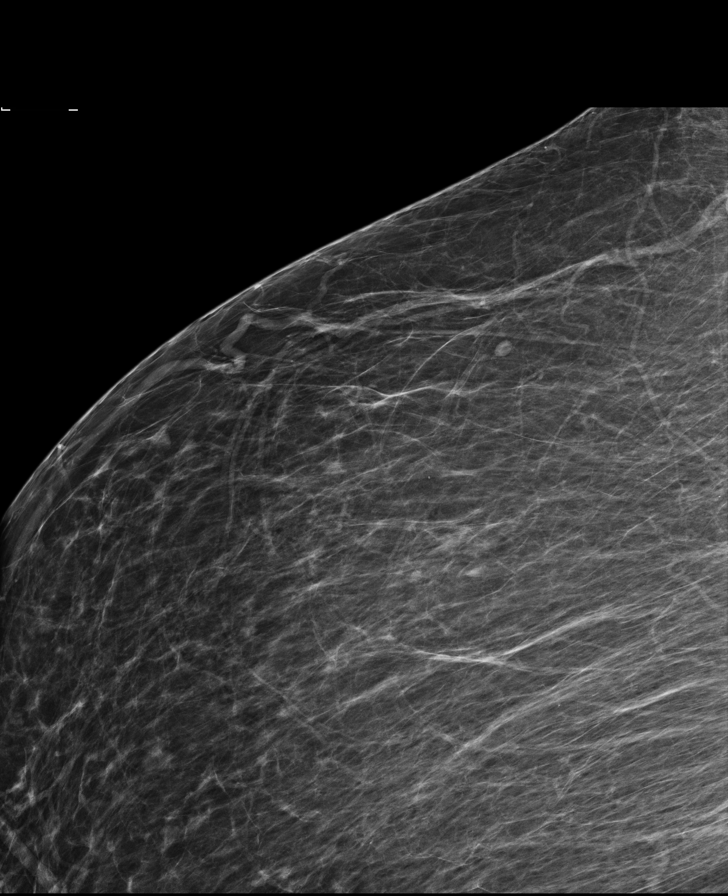

[L XCCL]
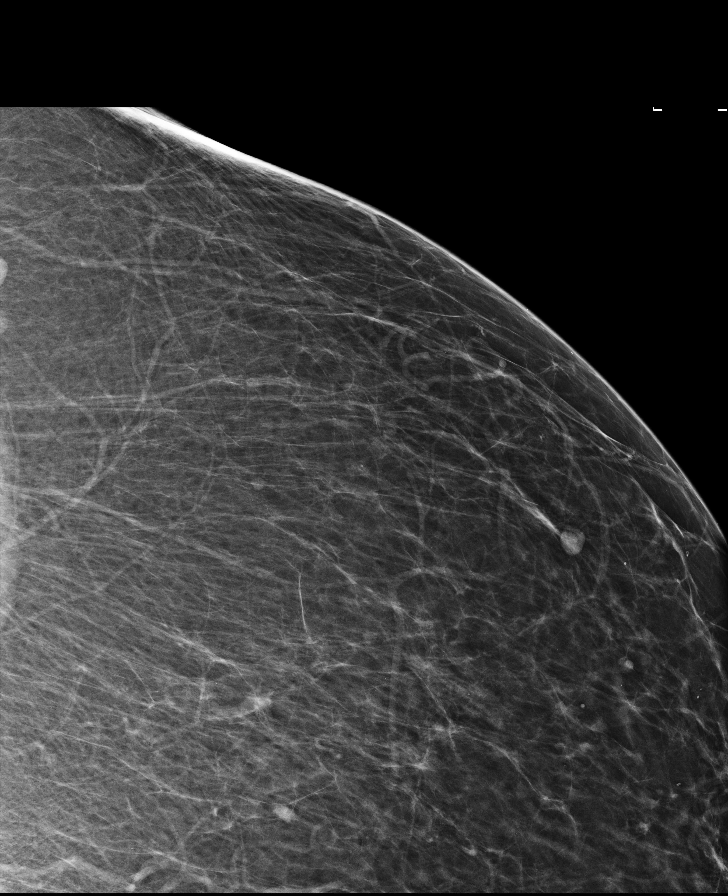

[R MLO (2 of 2)]
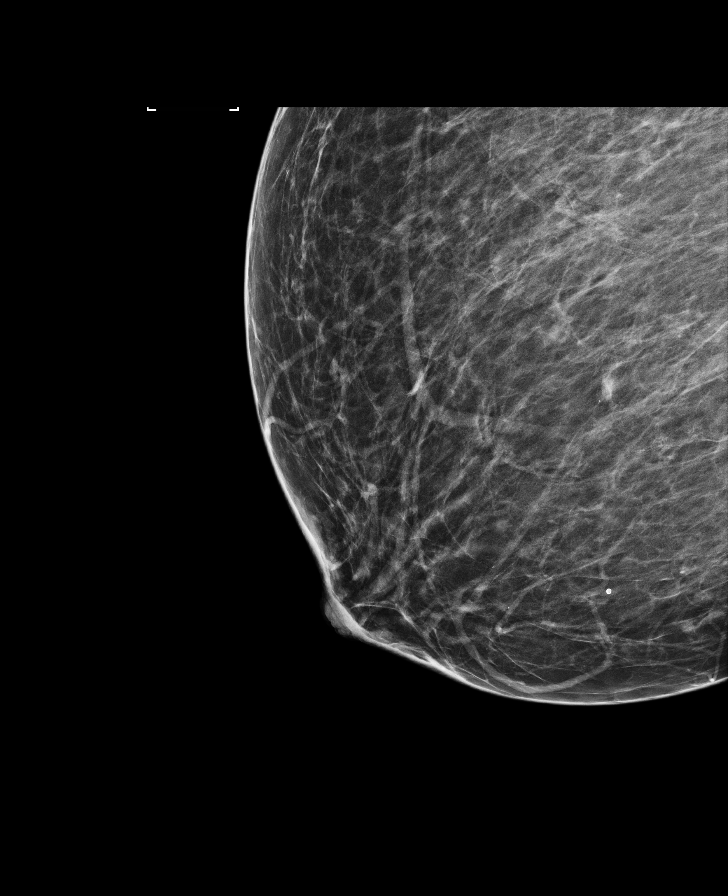

[L MLO (2 of 2)]
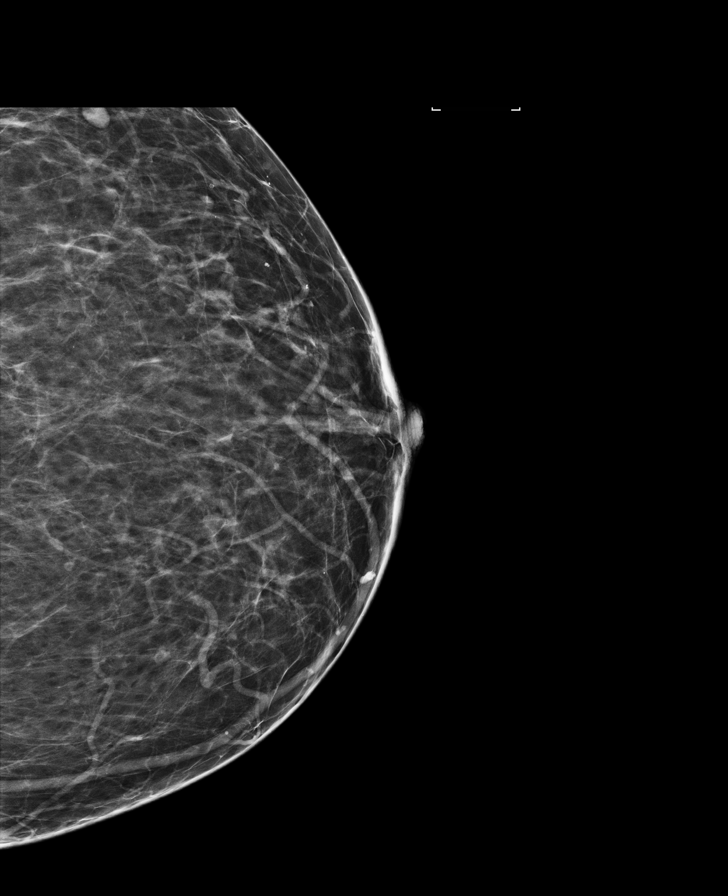

[8 of 13 positions shown; findings below may reference images not displayed]

ACR Breast Density Category b: There are scattered areas of
fibroglandular density.
FINDINGS: There are no findings suspicious for malignancy. Images were
processed with CAD.
IMPRESSION: No mammographic evidence of malignancy. A result letter of this
screening mammogram will be mailed directly to the patient.

RECOMMENDATION:
Screening mammogram in one year. (Code:AS-G-LCT)

BI-RADS CATEGORY  1: Negative.

## 2022-04-14 ENCOUNTER — Ambulatory Visit (HOSPITAL_BASED_OUTPATIENT_CLINIC_OR_DEPARTMENT_OTHER): Payer: 59 | Admitting: Obstetrics & Gynecology

## 2022-04-28 ENCOUNTER — Other Ambulatory Visit (HOSPITAL_COMMUNITY)
Admission: RE | Admit: 2022-04-28 | Discharge: 2022-04-28 | Disposition: A | Discharge status: Home / Self Care | Payer: Commercial Managed Care - HMO | Source: Ambulatory Visit | Attending: Obstetrics & Gynecology | Admitting: Obstetrics & Gynecology

## 2022-04-28 ENCOUNTER — Ambulatory Visit (INDEPENDENT_AMBULATORY_CARE_PROVIDER_SITE_OTHER): Payer: Commercial Managed Care - HMO | Admitting: Obstetrics & Gynecology

## 2022-04-28 ENCOUNTER — Encounter (HOSPITAL_BASED_OUTPATIENT_CLINIC_OR_DEPARTMENT_OTHER): Payer: Self-pay | Admitting: Obstetrics & Gynecology

## 2022-04-28 VITALS — BP 115/81 | HR 96 | Ht 65.0 in | Wt 323.8 lb

## 2022-04-28 DIAGNOSIS — Z113 Encounter for screening for infections with a predominantly sexual mode of transmission: Secondary | ICD-10-CM | POA: Insufficient documentation

## 2022-04-28 DIAGNOSIS — Z1231 Encounter for screening mammogram for malignant neoplasm of breast: Secondary | ICD-10-CM | POA: Diagnosis not present

## 2022-04-28 DIAGNOSIS — Z01419 Encounter for gynecological examination (general) (routine) without abnormal findings: Secondary | ICD-10-CM

## 2022-04-28 DIAGNOSIS — I10 Essential (primary) hypertension: Secondary | ICD-10-CM

## 2022-04-28 DIAGNOSIS — Z13820 Encounter for screening for osteoporosis: Secondary | ICD-10-CM | POA: Diagnosis not present

## 2022-04-28 DIAGNOSIS — Z6841 Body Mass Index (BMI) 40.0 and over, adult: Secondary | ICD-10-CM

## 2022-04-28 NOTE — Progress Notes (Unsigned)
66 y.o. [id number] Divorced Black or [place] American female here for annual exam.  Denies vaginal bleeding.  Newly seeing new person.  Excited about this.  Had prior partners so will obtain STD screening today.  Saw Dr. Felipa Eth in September.  He has retired and she does have a new PCP.  Patient's last menstrual period was 08/05/2018 (lmp unknown).          Sexually active: Yes.    The current method of family planning is post menopausal status.    Smoker:  no  Health Maintenance: Pap:  04/11/2021 Negative History of abnormal Pap:  no MMG:  04/01/2021 Negative Colonoscopy:  04/19/2012, has consult with Dr. Collene Mares next month BMD:   ordered Screening Labs: done with Dr. Felipa Eth   reports that she has never smoked. She has never used smokeless tobacco. She reports that she does not drink alcohol and does not use drugs.  Past Medical History:  Diagnosis Date   Fibroid    History of migraine    Hypertension    Morbid obesity ([place])    Peptic ulcer disease 1998   normal EGD 2004    Past Surgical History:  Procedure Laterality Date   BLADDER SURGERY     Stretched   COLONOSCOPY  04/19/2012   Procedure: COLONOSCOPY;  Surgeon: Juanita Craver, MD;  [facility];  Service: Endoscopy;  Laterality: N/A;   DILATATION & CURETTAGE/HYSTEROSCOPY WITH MYOSURE N/A 04/18/2019   Procedure: DILATATION & CURETTAGE/HYSTEROSCOPY WITH MYOSURE POLYP RESECTION;  Surgeon: Megan Salon, MD;  Location: [place];  Service: Gynecology;  Laterality: N/A;   DILATION AND CURETTAGE OF UTERUS     with miscarriage   TIBIA FRACTURE SURGERY  1992   7 pins in tibia, 2 pins in knee cap    Current Outpatient Medications  Medication Sig Dispense Refill   acetaminophen (TYLENOL) 500 MG tablet Take 1,000 mg by mouth every 6 (six) hours as needed for mild pain or headache.     cholecalciferol (VITAMIN D) 25 MCG (1000 UT) tablet Take 1,000 Units by mouth daily. D3     Garlic 3704 MG CAPS Take 1,000 mg by mouth  daily.     ibuprofen (ADVIL) 800 MG tablet Take 1 tablet (800 mg total) by mouth every 8 (eight) hours as needed. 30 tablet 0   lisinopril-hydrochlorothiazide (ZESTORETIC) 10-12.5 MG tablet Take 1 tablet by mouth daily.     Multiple Vitamin (MULTIVITAMIN) tablet Take 1 tablet by mouth daily.     vitamin C (ASCORBIC ACID) 500 MG tablet Take 500 mg by mouth daily.     No current facility-administered medications for this visit.    Family History  Problem Relation Age of Onset   Hypertension Mother    Diabetes Mellitus I Mother    Hypertension Father    Lupus Father    Prostate cancer Father    CAD Father        a. s/p CABG and valve surgery @ 63.   Valvular heart disease Father    Hypertension Brother    Diabetes Mellitus I Brother    Suicidality Neg Hx    Alcohol abuse Neg Hx    Anxiety disorder Neg Hx    Bipolar disorder Neg Hx    Depression Neg Hx    Drug abuse Neg Hx     ROS: Constitutional: negative Genitourinary:negative  Exam:   BP 115/81 (BP Location: Right Arm, Patient Position: Sitting, Cuff Size: Large)   Pulse 96   Ht  $'5\' 5"'V$  (1.651 m) Comment: Reported  Wt (!) 323 lb 12.8 oz (146.9 kg)   LMP 08/05/2018 (LMP Unknown)   BMI 53.88 kg/m   Height: '5\' 5"'$  (165.1 cm) (Reported)  General appearance: alert, cooperative and appears stated age Head: Normocephalic, without obvious abnormality, atraumatic Neck: no adenopathy, supple, symmetrical, trachea midline and thyroid normal to inspection and palpation Lungs: clear to auscultation bilaterally Breasts: normal appearance, no masses or tenderness Heart: regular rate and rhythm Abdomen: soft, non-tender; bowel sounds normal; no masses,  no organomegaly Extremities: extremities normal, atraumatic, no cyanosis or edema Skin: Skin color, texture, turgor normal. No rashes or lesions Lymph nodes: Cervical, supraclavicular, and axillary nodes normal. No abnormal inguinal nodes palpated Neurologic: Grossly  normal   Pelvic: External genitalia:  no lesions              Urethra:  normal appearing urethra with no masses, tenderness or lesions              Bartholins and Skenes: normal                 Vagina: normal appearing vagina with normal color and no discharge, no lesions              Cervix: no lesions              Pap taken: No. Bimanual Exam:  Uterus:  normal size, contour, position, consistency, mobility, non-tender              Adnexa: normal adnexa and no mass, fullness, tenderness               Rectovaginal: Confirms               Anus:  normal sphincter tone, no lesions  Chaperone, Octaviano Batty, CMA, was present for exam.  Assessment/Plan: 1. Well woman exam with routine gynecological exam - Pap smear 03/2021 neg with neg HR HPV - Mammogram 03/2021 - Colonoscopy 03/2012, has consult scheduled with Dr. Collene Mares next mont - Bone mineral density ordered - lab work done with PCP - vaccines reviewed/updated  2. Encounter for screening mammogram for malignant neoplasm of breast - MM 3D SCREEN BREAST BILATERAL; Future  3. Osteoporosis screening - DG BONE DENSITY (DXA); Future  4. Screening examination for STD (sexually transmitted disease) - RPR+HBsAg+HIV - Hepatitis C antibody - Cervicovaginal ancillary only( [place])  5. Primary hypertension  6. Class 3 severe obesity without serious comorbidity with body mass index (BMI) of 50.0 to 59.9 in adult, unspecified obesity type ([place])

## 2022-04-29 LAB — RPR+HBSAG+HIV
HIV Screen 4th Generation wRfx: NONREACTIVE
Hepatitis B Surface Ag: NEGATIVE
RPR Ser Ql: NONREACTIVE

## 2022-04-29 LAB — HEPATITIS C ANTIBODY: Hep C Virus Ab: NONREACTIVE

## 2022-04-30 LAB — CERVICOVAGINAL ANCILLARY ONLY
Chlamydia: NEGATIVE
Comment: NEGATIVE
Comment: NEGATIVE
Comment: NORMAL
Neisseria Gonorrhea: NEGATIVE
Trichomonas: NEGATIVE

## 2022-06-04 LAB — COLOGUARD: Cologuard: NEGATIVE

## 2022-06-09 ENCOUNTER — Ambulatory Visit (INDEPENDENT_AMBULATORY_CARE_PROVIDER_SITE_OTHER): Payer: Commercial Managed Care - HMO

## 2022-06-09 ENCOUNTER — Ambulatory Visit: Payer: Commercial Managed Care - HMO | Admitting: Orthopedic Surgery

## 2022-06-09 DIAGNOSIS — M79604 Pain in right leg: Secondary | ICD-10-CM

## 2022-06-09 DIAGNOSIS — M79671 Pain in right foot: Secondary | ICD-10-CM

## 2022-06-11 ENCOUNTER — Encounter: Payer: Self-pay | Admitting: Orthopedic Surgery

## 2022-06-12 NOTE — Progress Notes (Signed)
Office Visit Note   Patient: Jacqueline Mcclure           Date of Birth: [birth year]           MRN: [id number] Visit Date: 06/09/2022 Requested by: Lajean Manes, MD [address] [place],  [place] [address] PCP: Lajean Manes, MD  Subjective: Chief Complaint  Patient presents with   Right Knee - Pain   Right Leg - Pain    HPI: Jacqueline Mcclure is a 66 y.o. female who presents to the office reporting right knee and leg pain.  Pain has been going on for several months.  Reports global knee pain as well as decreased range of motion due to the pain.  The pain wakes her from sleep at night.  Reports no locking but does have some popping stiffness and swelling.  At times she has to limp.  She has to lift her leg with her hand to get into the car.  She also reports some heel pain on that right-hand side.  Her pain was significant enough that she had to stop playing tennis.  Denies any numbness and tingling in the leg.  She states that she can walk "pretty far".  Pain is along the posterior aspect of the knee.  In the morning she does not really report much stiffness in the knee.  She also reports significant heel pain on the right-hand side which is equally debilitating as her knee..                ROS: All systems reviewed are negative as they relate to the chief complaint within the history of present illness.  Patient denies fevers or chills.  Assessment & Plan: Visit Diagnoses:  1. Pain in right leg     Plan: Impression is right knee pain with effusion present.  This could represent degenerative meniscal tear on that medial side.  Symptoms ongoing for 2 months with failure of conservative management.  BMI increased which could also contribute to her symptoms.  Does not look like it is referred pain from the hip or back.  Patient also has heel pain which she would like evaluated by a foot and ankle specialist.  Plan to refer her to Dr. Sharol Given for that.  Follow-up  with me after the MRI scan of her knee.  Cell number or contact number during the daytime is [phone] extension 3-1  Follow-Up Instructions: No follow-ups on file.   Orders:  Orders Placed This Encounter  Procedures   XR KNEE 3 VIEW RIGHT   XR Lumbar Spine 2-3 Views   No orders of the defined types were placed in this encounter.     Procedures: No procedures performed   Clinical Data: No additional findings.  Objective: Vital Signs: LMP 08/05/2018 (LMP Unknown)   Physical Exam:  Constitutional: Patient appears well-developed HEENT:  Head: Normocephalic Eyes:EOM are normal Neck: Normal range of motion Cardiovascular: Normal rate Pulmonary/chest: Effort normal Neurologic: Patient is alert Skin: Skin is warm Psychiatric: Patient has normal mood and affect  Ortho Exam: Ortho exam demonstrates slightly antalgic gait to the right.  Heel cords a little tight on the right-hand side but pedal pulses palpable.  Patient has fairly symmetric tibiotalar subtalar transverse tarsal range of motion with palpable nontender intact anterior to posterior to peroneal and Achilles tendons.  Right knee demonstrates mild effusion.  Collateral cruciate ligaments are stable.  Medial joint line tenderness is present.  No masses lymphadenopathy or skin  change in the posterior aspect of the right knee.  No groin pain on the right with internal or external rotation of that right leg.  Specialty Comments:  No specialty comments available.  Imaging: No results found.   PMFS History: Patient Active Problem List   Diagnosis Date Noted   Uterine leiomyoma 09/12/2018   Obesity    HTN (hypertension) 04/11/2014   Past Medical History:  Diagnosis Date   Fibroid    History of migraine    Hypertension    Morbid obesity ([place])    Peptic ulcer disease 1998   normal EGD 2004    Family History  Problem Relation Age of Onset   Hypertension Mother    Diabetes Mellitus I Mother    Hypertension  Father    Lupus Father    Prostate cancer Father    CAD Father        a. s/p CABG and valve surgery @ 87.   Valvular heart disease Father    Hypertension Brother    Diabetes Mellitus I Brother    Suicidality Neg Hx    Alcohol abuse Neg Hx    Anxiety disorder Neg Hx    Bipolar disorder Neg Hx    Depression Neg Hx    Drug abuse Neg Hx     Past Surgical History:  Procedure Laterality Date   BLADDER SURGERY     Stretched   COLONOSCOPY  04/19/2012   Procedure: COLONOSCOPY;  Surgeon: Juanita Craver, MD;  Location: [facility];  Service: Endoscopy;  Laterality: N/A;   DILATATION & CURETTAGE/HYSTEROSCOPY WITH MYOSURE N/A 04/18/2019   Procedure: DILATATION & CURETTAGE/HYSTEROSCOPY WITH MYOSURE POLYP RESECTION;  Surgeon: Megan Salon, MD;  Location: [facility];  Service: Gynecology;  Laterality: N/A;   DILATION AND CURETTAGE OF UTERUS     with miscarriage   TIBIA FRACTURE SURGERY  1992   7 pins in tibia, 2 pins in knee cap   Social History   Occupational History   Not on file  Tobacco Use   Smoking status: Never   Smokeless tobacco: Never  Vaping Use   Vaping Use: Never used  Substance and Sexual Activity   Alcohol use: No   Drug use: No   Sexual activity: Not Currently    Birth control/protection: Post-menopausal

## 2022-06-13 NOTE — Addendum Note (Signed)
Addended byLaurann Montana on: 06/13/2022 01:20 PM   Modules accepted: Orders

## 2022-06-16 ENCOUNTER — Ambulatory Visit (INDEPENDENT_AMBULATORY_CARE_PROVIDER_SITE_OTHER): Payer: Commercial Managed Care - HMO

## 2022-06-16 ENCOUNTER — Ambulatory Visit: Payer: Self-pay

## 2022-06-16 ENCOUNTER — Ambulatory Visit (INDEPENDENT_AMBULATORY_CARE_PROVIDER_SITE_OTHER): Payer: Commercial Managed Care - HMO | Admitting: Orthopedic Surgery

## 2022-06-16 DIAGNOSIS — M79672 Pain in left foot: Secondary | ICD-10-CM

## 2022-06-16 DIAGNOSIS — M6701 Short Achilles tendon (acquired), right ankle: Secondary | ICD-10-CM | POA: Diagnosis not present

## 2022-06-16 DIAGNOSIS — M79671 Pain in right foot: Secondary | ICD-10-CM

## 2022-06-16 DIAGNOSIS — M84375A Stress fracture, left foot, initial encounter for fracture: Secondary | ICD-10-CM

## 2022-06-16 DIAGNOSIS — M2021 Hallux rigidus, right foot: Secondary | ICD-10-CM

## 2022-06-17 ENCOUNTER — Encounter: Payer: Self-pay | Admitting: Orthopedic Surgery

## 2022-06-17 NOTE — Progress Notes (Signed)
Office Visit Note   Patient: Jacqueline Mcclure           Date of Birth: Apr 01, 1956           MRN: [id number] Visit Date: 06/16/2022              Requested by: Meredith Pel, MD [address] [place],  [place] [address] PCP: Lajean Manes, MD  Chief Complaint  Patient presents with   Right Foot - Pain   Left Foot - Pain      HPI: Patient is a 66 year old woman who was seen in referral from Dr. Marlou Sa with bilateral foot pain.  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  Patient complains of pain in the right foot beneath the first metatarsal head for several months.  Patient complains of left heel pain.  This has been present for 1 week she denies any specific injury she states she has constant pain does not improve as the day goes on.  Assessment & Plan: Visit Diagnoses:  1. Pain in right foot   2. Pain in left foot     Plan: Patient seems to have a stress reaction in the left calcaneus.  Recommended a gel heel cup or Tuli heel cup.  For the right foot she does have Achilles tightness pes planus and hallux rigidus.  Will start with Achilles stretching this was demonstrated.  Follow-Up Instructions: Return in about 4 weeks (around 07/14/2022).   Ortho Exam  Patient is alert, oriented, no adenopathy, well-dressed, normal affect, normal respiratory effort. Examination patient has a good dorsalis pedis pulse bilaterally.  The right foot she has pes planus with Achilles contracture with dorsiflexion to neutral with the knee extended.  There is also hallux rigidus of the great toe with dorsiflexion only about 30 degrees.  Pain to palpation directly beneath the first metatarsal head consistent with overloading from the hallux rigidus and Achilles contracture.  Examination of the left heel the peroneal and posterior tibial tendon are nontender to palpation.  The plantar fascia is nontender to palpation.  She has pain reproduced with lateral  compression of the calcaneus consistent with a stress reaction of the bone.  Imaging: XR Foot 2 Views Left  Result Date: 06/17/2022 2 view radiographs of the left foot shows no fractures no subluxations.  No periarticular cysts.   XR Foot 2 Views Right  Result Date: 06/17/2022 2 view radiographs of the right foot shows a long second and third metatarsal.  There is no fractures no subluxation through the joints.  No periarticular cystic changes.  No images are attached to the encounter.  Labs: No results found for: "HGBA1C", "ESRSEDRATE", "CRP", "LABURIC", "REPTSTATUS", "GRAMSTAIN", "CULT", "LABORGA"   No results found for: "ALBUMIN", "PREALBUMIN", "CBC"  No results found for: "MG" No results found for: "VD25OH"  No results found for: "PREALBUMIN"    Latest Ref Rng & Units 04/13/2019   11:52 AM 07/05/2007    2:48 PM 07/05/2007    2:00 PM  CBC EXTENDED  WBC 4.0 - 10.5 K/uL 6.9   8.4   RBC 3.87 - 5.11 MIL/uL 4.90   4.72   Hemoglobin 12.0 - 15.0 g/dL 13.3  14.3  12.3   HCT 36.0 - 46.0 % 41.3  42.0  37.5   Platelets 150 - 400 K/uL 287   302   NEUT#    6.3   Lymph#    1.4  There is no height or weight on file to calculate BMI.  Orders:  Orders Placed This Encounter  Procedures   XR Foot 2 Views Right   XR Foot 2 Views Left   No orders of the defined types were placed in this encounter.    Procedures: No procedures performed  Clinical Data: No additional findings.  ROS:  All other systems negative, except as noted in the HPI. Review of Systems  Objective: Vital Signs: LMP 08/05/2018 (LMP Unknown)   Specialty Comments:  No specialty comments available.  PMFS History: Patient Active Problem List   Diagnosis Date Noted   Uterine leiomyoma 09/12/2018   Obesity    HTN (hypertension) 04/11/2014   Past Medical History:  Diagnosis Date   Fibroid    History of migraine    Hypertension    Morbid obesity ([place])    Peptic ulcer disease 1998   normal EGD  2004    Family History  Problem Relation Age of Onset   Hypertension Mother    Diabetes Mellitus I Mother    Hypertension Father    Lupus Father    Prostate cancer Father    CAD Father        a. s/p CABG and valve surgery @ 39.   Valvular heart disease Father    Hypertension Brother    Diabetes Mellitus I Brother    Suicidality Neg Hx    Alcohol abuse Neg Hx    Anxiety disorder Neg Hx    Bipolar disorder Neg Hx    Depression Neg Hx    Drug abuse Neg Hx     Past Surgical History:  Procedure Laterality Date   BLADDER SURGERY     Stretched   COLONOSCOPY  04/19/2012   Procedure: COLONOSCOPY;  Surgeon: Juanita Craver, MD;  Location: [facility];  Service: Endoscopy;  Laterality: N/A;   DILATATION & CURETTAGE/HYSTEROSCOPY WITH MYOSURE N/A 04/18/2019   Procedure: DILATATION & CURETTAGE/HYSTEROSCOPY WITH MYOSURE POLYP RESECTION;  Surgeon: Megan Salon, MD;  Location: [facility];  Service: Gynecology;  Laterality: N/A;   DILATION AND CURETTAGE OF UTERUS     with miscarriage   TIBIA FRACTURE SURGERY  1992   7 pins in tibia, 2 pins in knee cap   Social History   Occupational History   Not on file  Tobacco Use   Smoking status: Never   Smokeless tobacco: Never  Vaping Use   Vaping Use: Never used  Substance and Sexual Activity   Alcohol use: No   Drug use: No   Sexual activity: Not Currently    Birth control/protection: Post-menopausal

## 2022-06-19 ENCOUNTER — Ambulatory Visit
Admission: RE | Admit: 2022-06-19 | Discharge: 2022-06-19 | Disposition: A | Discharge status: Home / Self Care | Payer: Commercial Managed Care - HMO | Source: Ambulatory Visit | Attending: Obstetrics & Gynecology | Admitting: Obstetrics & Gynecology

## 2022-06-19 DIAGNOSIS — Z1231 Encounter for screening mammogram for malignant neoplasm of breast: Secondary | ICD-10-CM

## 2022-06-26 ENCOUNTER — Encounter (HOSPITAL_BASED_OUTPATIENT_CLINIC_OR_DEPARTMENT_OTHER): Payer: Self-pay | Admitting: *Deleted

## 2022-07-24 ENCOUNTER — Ambulatory Visit
Admission: RE | Admit: 2022-07-24 | Discharge: 2022-07-24 | Disposition: A | Discharge status: Home / Self Care | Payer: Commercial Managed Care - HMO | Source: Ambulatory Visit | Attending: Orthopedic Surgery | Admitting: Orthopedic Surgery

## 2022-07-24 DIAGNOSIS — M79604 Pain in right leg: Secondary | ICD-10-CM

## 2022-08-11 ENCOUNTER — Ambulatory Visit (INDEPENDENT_AMBULATORY_CARE_PROVIDER_SITE_OTHER): Payer: Commercial Managed Care - HMO

## 2022-08-11 ENCOUNTER — Ambulatory Visit: Payer: Commercial Managed Care - HMO | Admitting: Podiatry

## 2022-08-11 DIAGNOSIS — M2141 Flat foot [pes planus] (acquired), right foot: Secondary | ICD-10-CM

## 2022-08-11 DIAGNOSIS — M722 Plantar fascial fibromatosis: Secondary | ICD-10-CM

## 2022-08-11 DIAGNOSIS — M2142 Flat foot [pes planus] (acquired), left foot: Secondary | ICD-10-CM | POA: Diagnosis not present

## 2022-08-11 DIAGNOSIS — M79671 Pain in right foot: Secondary | ICD-10-CM | POA: Diagnosis not present

## 2022-08-11 DIAGNOSIS — M79672 Pain in left foot: Secondary | ICD-10-CM

## 2022-08-11 MED ORDER — TRIAMCINOLONE ACETONIDE 10 MG/ML IJ SUSP
10.0000 mg | Freq: Once | INTRAMUSCULAR | Status: AC
  Administered 2022-08-11: 10 mg

## 2022-08-11 MED ORDER — MELOXICAM 7.5 MG PO TABS: 7.5000 mg | ORAL_TABLET | Freq: Every day | ORAL | 0 refills | Status: DC | PRN

## 2022-08-11 NOTE — Progress Notes (Signed)
Subjective:   Patient ID: Jacqueline Mcclure, female   DOB: 67 y.o.   MRN: [id number]   HPI Chief Complaint  Patient presents with   Foot Pain    Bilateral heel pain     67 year old female presents the office for above concerns.  This has been ongoing for awhile. Left is worse than the right. She has tried exercises, heel raisers. The pain is worse in the morning or after rest. No injuries. No numbness/tingling.  No other concerns today.   Review of Systems  All other systems reviewed and are negative.  Past Medical History:  Diagnosis Date   Fibroid    History of migraine    Hypertension    Morbid obesity ([place])    Peptic ulcer disease 1998   normal EGD 2004    Past Surgical History:  Procedure Laterality Date   BLADDER SURGERY     Stretched   COLONOSCOPY  04/19/2012   Procedure: COLONOSCOPY;  Surgeon: Juanita Craver, MD;  Location: [facility];  Service: Endoscopy;  Laterality: N/A;   DILATATION & CURETTAGE/HYSTEROSCOPY WITH MYOSURE N/A 04/18/2019   Procedure: DILATATION & CURETTAGE/HYSTEROSCOPY WITH MYOSURE POLYP RESECTION;  Surgeon: Megan Salon, MD;  Location: [facility];  Service: Gynecology;  Laterality: N/A;   DILATION AND CURETTAGE OF UTERUS     with miscarriage   TIBIA FRACTURE SURGERY  1992   7 pins in tibia, 2 pins in knee cap     Current Outpatient Medications:    acetaminophen (TYLENOL) 500 MG tablet, Take 1,000 mg by mouth every 6 (six) hours as needed for mild pain or headache., Disp: , Rfl:    cholecalciferol (VITAMIN D) 25 MCG (1000 UT) tablet, Take 1,000 Units by mouth daily. D3, Disp: , Rfl:    Garlic 123XX123 MG CAPS, Take 1,000 mg by mouth daily., Disp: , Rfl:    lisinopril-hydrochlorothiazide (ZESTORETIC) 10-12.5 MG tablet, Take 1 tablet by mouth daily., Disp: , Rfl:    meloxicam (MOBIC) 7.5 MG tablet, Take 1 tablet (7.5 mg total) by mouth daily as needed for pain., Disp: 30 tablet, Rfl: 0   Multiple Vitamin (MULTIVITAMIN) tablet, Take 1 tablet by  mouth daily., Disp: , Rfl:    vitamin C (ASCORBIC ACID) 500 MG tablet, Take 500 mg by mouth daily., Disp: , Rfl:   No Known Allergies         Objective:  Physical Exam  General: AAO x3, NAD  Dermatological: Skin is warm, dry and supple bilateral. There are no open sores, no preulcerative lesions, no rash or signs of infection present.  Vascular: Dorsalis Pedis artery and Posterior Tibial artery pedal pulses are 2/4 bilateral with immedate capillary fill time.  There is no pain with calf compression, swelling, warmth, erythema.   Neruologic: Grossly intact via light touch bilateral.   Musculoskeletal: Tenderness to palpation along the plantar medial tubercle of the calcaneus at the insertion of plantar fascia on the left and right foot. There is no pain along the course of the plantar fascia within the arch of the foot. Plantar fascia appears to be intact. There is no pain with lateral compression of the calcaneus or pain with vibratory sensation. There is no pain along the course or insertion of the achilles tendon. No other areas of tenderness to bilateral lower extremities.  Flatfoot is present.  Gait: Unassisted, Nonantalgic.       Assessment:   67 year old female with plantar fasciitis, flatfoot     Plan:  -Treatment options discussed including  all alternatives, risks, and complications -Etiology of symptoms were discussed -X-rays were obtained and reviewed with the patient.  3 views bilateral feet were obtained.  Decreased calcaneal on current single.  No evidence of acute fracture. -Steroid injection performed.  See procedure note below. -Plantar fascial brace dispensed x 2 to help support plantar fascia. -Night splint dispensed to help stretch the plantar fascia, Achilles -We discussed stretching, icing a regular basis.  Because she modifications good arch support.  Procedure: Injection Tendon/Ligament Discussed alternatives, risks, complications and verbal consent was  obtained.  Location: Bilateral plantar fascia at the glabrous junction; medial approach. Skin Prep: Alcohol. Injectate: 0.5cc 0.5% marcaine plain, 0.5 cc 2% lidocaine plain and, 1 cc kenalog 10. Disposition: Patient tolerated procedure well. Injection site dressed with a band-aid.  Post-injection care was discussed and return precautions discussed.   Return in about 6 weeks (around 09/22/2022).  Trula Slade DPM

## 2022-08-11 NOTE — Patient Instructions (Signed)
For instructions on how to put on your Plantar Fascial Brace, please visit [email or web address]  --  While at your visit today you received a steroid injection in your foot or ankle to help with your pain. Along with having the steroid medication there is some "numbing" medication in the shot that you received. Due to this you may notice some numbness to the area for the next couple of hours.   I would recommend limiting activity for the next few days to help the steroid injection take affect.    The actually benefit from the steroid injection may take up to 2-7 days to see a difference. You may actually experience a small (as in 10%) INCREASE in pain in the first 24 hours---that is common. It would be best if you can ice the area today and take anti-inflammatory medications (such as Ibuprofen, Motrin, or Aleve) if you are able to take these medications. If you were prescribed another medication to help with the pain go ahead and start that medication today    Things to watch out for that you should contact us or a health care provider urgently would include: 1. Unusual (as in more than 10%) increase in pain 2. New fever > 101.5 3. New swelling or redness of the injected area.  4. Streaking of red lines around the area injected.  If you have any questions or concerns about this, please give our office a call at [phone].    ----   Plantar Fasciitis (Heel Spur Syndrome) with Rehab The plantar fascia is a fibrous, ligament-like, soft-tissue structure that spans the bottom of the foot. Plantar fasciitis is a condition that causes pain in the foot due to inflammation of the tissue. SYMPTOMS  Pain and tenderness on the underneath side of the foot. Pain that worsens with standing or walking. CAUSES  Plantar fasciitis is caused by irritation and injury to the plantar fascia on the underneath side of the foot. Common mechanisms of injury include: Direct trauma to bottom of the  foot. Damage to a small nerve that runs under the foot where the main fascia attaches to the heel bone. Stress placed on the plantar fascia due to bone spurs. RISK INCREASES WITH:  Activities that place stress on the plantar fascia (running, jumping, pivoting, or cutting). Poor strength and flexibility. Improperly fitted shoes. Tight calf muscles. Flat feet. Failure to warm-up properly before activity. Obesity. PREVENTION Warm up and stretch properly before activity. Allow for adequate recovery between workouts. Maintain physical fitness: Strength, flexibility, and endurance. Cardiovascular fitness. Maintain a health body weight. Avoid stress on the plantar fascia. Wear properly fitted shoes, including arch supports for individuals who have flat feet.  PROGNOSIS  If treated properly, then the symptoms of plantar fasciitis usually resolve without surgery. However, occasionally surgery is necessary.  RELATED COMPLICATIONS  Recurrent symptoms that may result in a chronic condition. Problems of the lower back that are caused by compensating for the injury, such as limping. Pain or weakness of the foot during push-off following surgery. Chronic inflammation, scarring, and partial or complete fascia tear, occurring more often from repeated injections.  TREATMENT  Treatment initially involves the use of ice and medication to help reduce pain and inflammation. The use of strengthening and stretching exercises may help reduce pain with activity, especially stretches of the Achilles tendon. These exercises may be performed at home or with a therapist. Your caregiver may recommend that you use heel cups of arch supports to help reduce stress on  the plantar fascia. Occasionally, corticosteroid injections are given to reduce inflammation. If symptoms persist for greater than 6 months despite non-surgical (conservative), then surgery may be recommended.   MEDICATION  If pain medication is  necessary, then nonsteroidal anti-inflammatory medications, such as aspirin and ibuprofen, or other minor pain relievers, such as acetaminophen, are often recommended. Do not take pain medication within 7 days before surgery. Prescription pain relievers may be given if deemed necessary by your caregiver. Use only as directed and only as much as you need. Corticosteroid injections may be given by your caregiver. These injections should be reserved for the most serious cases, because they may only be given a certain number of times.  HEAT AND COLD Cold treatment (icing) relieves pain and reduces inflammation. Cold treatment should be applied for 10 to 15 minutes every 2 to 3 hours for inflammation and pain and immediately after any activity that aggravates your symptoms. Use ice packs or massage the area with a piece of ice (ice massage). Heat treatment may be used prior to performing the stretching and strengthening activities prescribed by your caregiver, physical therapist, or athletic trainer. Use a heat pack or soak the injury in warm water.  SEEK IMMEDIATE MEDICAL CARE IF: Treatment seems to offer no benefit, or the condition worsens. Any medications produce adverse side effects.  EXERCISES- RANGE OF MOTION (ROM) AND STRETCHING EXERCISES - Plantar Fasciitis (Heel Spur Syndrome) These exercises may help you when beginning to rehabilitate your injury. Your symptoms may resolve with or without further involvement from your physician, physical therapist or athletic trainer. While completing these exercises, remember:  Restoring tissue flexibility helps normal motion to return to the joints. This allows healthier, less painful movement and activity. An effective stretch should be held for at least 30 seconds. A stretch should never be painful. You should only feel a gentle lengthening or release in the stretched tissue.  RANGE OF MOTION - Toe Extension, Flexion Sit with your right / left leg  crossed over your opposite knee. Grasp your toes and gently pull them back toward the top of your foot. You should feel a stretch on the bottom of your toes and/or foot. Hold this stretch for 10 seconds. Now, gently pull your toes toward the bottom of your foot. You should feel a stretch on the top of your toes and or foot. Hold this stretch for 10 seconds. Repeat  times. Complete this stretch 3 times per day.   RANGE OF MOTION - Ankle Dorsiflexion, Active Assisted Remove shoes and sit on a chair that is preferably not on a carpeted surface. Place right / left foot under knee. Extend your opposite leg for support. Keeping your heel down, slide your right / left foot back toward the chair until you feel a stretch at your ankle or calf. If you do not feel a stretch, slide your bottom forward to the edge of the chair, while still keeping your heel down. Hold this stretch for 10 seconds. Repeat 3 times. Complete this stretch 2 times per day.   STRETCH  Gastroc, Standing Place hands on wall. Extend right / left leg, keeping the front knee somewhat bent. Slightly point your toes inward on your back foot. Keeping your right / left heel on the floor and your knee straight, shift your weight toward the wall, not allowing your back to arch. You should feel a gentle stretch in the right / left calf. Hold this position for 10 seconds. Repeat 3 times. Complete this  stretch 2 times per day.  STRETCH  Soleus, Standing Place hands on wall. Extend right / left leg, keeping the other knee somewhat bent. Slightly point your toes inward on your back foot. Keep your right / left heel on the floor, bend your back knee, and slightly shift your weight over the back leg so that you feel a gentle stretch deep in your back calf. Hold this position for 10 seconds. Repeat 3 times. Complete this stretch 2 times per day.  STRETCH  Gastrocsoleus, Standing  Note: This exercise can place a lot of stress on your foot  and ankle. Please complete this exercise only if specifically instructed by your caregiver.  Place the ball of your right / left foot on a step, keeping your other foot firmly on the same step. Hold on to the wall or a rail for balance. Slowly lift your other foot, allowing your body weight to press your heel down over the edge of the step. You should feel a stretch in your right / left calf. Hold this position for 10 seconds. Repeat this exercise with a slight bend in your right / left knee. Repeat 3 times. Complete this stretch 2 times per day.   STRENGTHENING EXERCISES - Plantar Fasciitis (Heel Spur Syndrome)  These exercises may help you when beginning to rehabilitate your injury. They may resolve your symptoms with or without further involvement from your physician, physical therapist or athletic trainer. While completing these exercises, remember:  Muscles can gain both the endurance and the strength needed for everyday activities through controlled exercises. Complete these exercises as instructed by your physician, physical therapist or athletic trainer. Progress the resistance and repetitions only as guided.  STRENGTH - Towel Curls Sit in a chair positioned on a non-carpeted surface. Place your foot on a towel, keeping your heel on the floor. Pull the towel toward your heel by only curling your toes. Keep your heel on the floor. Repeat 3 times. Complete this exercise 2 times per day.  STRENGTH - Ankle Inversion Secure one end of a rubber exercise band/tubing to a fixed object (table, pole). Loop the other end around your foot just before your toes. Place your fists between your knees. This will focus your strengthening at your ankle. Slowly, pull your big toe up and in, making sure the band/tubing is positioned to resist the entire motion. Hold this position for 10 seconds. Have your muscles resist the band/tubing as it slowly pulls your foot back to the starting position. Repeat 3  times. Complete this exercises 2 times per day.  Document Released: 06/16/2005 Document Revised: 09/08/2011 Document Reviewed: 09/28/2008 [facility] Patient Information 2014 [place], [place].

## 2022-09-22 ENCOUNTER — Ambulatory Visit: Payer: Commercial Managed Care - HMO | Admitting: Podiatry

## 2023-01-05 ENCOUNTER — Telehealth (HOSPITAL_BASED_OUTPATIENT_CLINIC_OR_DEPARTMENT_OTHER): Payer: Self-pay | Admitting: *Deleted

## 2023-01-05 NOTE — Telephone Encounter (Signed)
Patient called left a message stating that she having pain on her right side near her groin and needs be seen .

## 2023-01-12 NOTE — Telephone Encounter (Signed)
LMOVM for pt to call office 

## 2023-01-13 NOTE — Telephone Encounter (Signed)
Pt had experienced a sharp pain that started in her pelvic area and radiated through her pelvic. It felt like a pinched nerve and extended to her foot and caused her toe to go numb. She is currently seeing a chiropractor for this issue and receiving acupuncture, which has helped. Pt will call back if symptoms do not continue to improve or if gyn symptoms worsen.

## 2023-03-04 ENCOUNTER — Other Ambulatory Visit (HOSPITAL_COMMUNITY)
Admission: RE | Admit: 2023-03-04 | Discharge: 2023-03-04 | Disposition: A | Discharge status: Home / Self Care | Payer: Commercial Managed Care - HMO | Source: Ambulatory Visit | Attending: Obstetrics & Gynecology | Admitting: Obstetrics & Gynecology

## 2023-03-04 ENCOUNTER — Ambulatory Visit (HOSPITAL_BASED_OUTPATIENT_CLINIC_OR_DEPARTMENT_OTHER): Payer: Commercial Managed Care - HMO | Admitting: Obstetrics & Gynecology

## 2023-03-04 ENCOUNTER — Encounter (HOSPITAL_BASED_OUTPATIENT_CLINIC_OR_DEPARTMENT_OTHER): Payer: Self-pay | Admitting: Obstetrics & Gynecology

## 2023-03-04 VITALS — BP 99/87 | HR 91 | Ht 65.0 in | Wt 294.8 lb

## 2023-03-04 DIAGNOSIS — N898 Other specified noninflammatory disorders of vagina: Secondary | ICD-10-CM | POA: Insufficient documentation

## 2023-03-04 DIAGNOSIS — R1031 Right lower quadrant pain: Secondary | ICD-10-CM

## 2023-03-04 DIAGNOSIS — M25561 Pain in right knee: Secondary | ICD-10-CM | POA: Diagnosis not present

## 2023-03-04 DIAGNOSIS — Z202 Contact with and (suspected) exposure to infections with a predominantly sexual mode of transmission: Secondary | ICD-10-CM | POA: Insufficient documentation

## 2023-03-04 DIAGNOSIS — G8929 Other chronic pain: Secondary | ICD-10-CM

## 2023-03-04 NOTE — Patient Instructions (Signed)
Jacqueline Mcclure Address: [address], [place], [place] [address] Phone: [phone]

## 2023-03-04 NOTE — Progress Notes (Signed)
GYNECOLOGY  VISIT  CC:   groin pain  HPI: 67 y.o. G53P1021 Divorced Black or [place] American female here for complaint of pain in her right groin.  Reports she has been seeing a chiropractor for sciatic nerve pain and this is improved.  Denies vaginal bleeding or discharge or odor.  Currently, sexually active.  Uses condoms but not every time.    Did have some constipation with [place] but this has resolved.  Denies diarrhea recently.  GI issues she was having with this resolved.  Highest weight was 403.  She doesn't really want to be on medication for weight loss if possible.    Unrelated, she is also having right knee pain.  Has trouble with extension, she has pain on the back of the knee.  Feels stiff if in the same position for any length of time.  With walking, the knee feels like it actually hyperextends.  The knee doesn't typically hurt at night when she if off of it.  She does take motrin for this but not every day.  H/o tibial fracture on the left side.  This has been going on for several months.  Last pap 04/11/2021.     Past Medical History:  Diagnosis Date   Fibroid    History of migraine    Hypertension    Morbid obesity (HCC)    Peptic ulcer disease 1998   normal EGD 2004    MEDS:   Current Outpatient Medications on File Prior to Visit  Medication Sig Dispense Refill   acetaminophen (TYLENOL) 500 MG tablet Take 1,000 mg by mouth every 6 (six) hours as needed for mild pain or headache.     cholecalciferol (VITAMIN D) 25 MCG (1000 UT) tablet Take 1,000 Units by mouth daily. D3     Garlic 1000 MG CAPS Take 1,000 mg by mouth daily.     lisinopril-hydrochlorothiazide (ZESTORETIC) 10-12.5 MG tablet Take 1 tablet by mouth daily.     Multiple Vitamin (MULTIVITAMIN) tablet Take 1 tablet by mouth daily.     vitamin C (ASCORBIC ACID) 500 MG tablet Take 500 mg by mouth daily.     No current facility-administered medications on file prior to visit.    ALLERGIES: Patient has no  known allergies.  SH:  divorced, non smoker  Review of Systems  Constitutional: Negative.   Gastrointestinal:  Positive for abdominal pain (right lower quadrant/groin pain).    PHYSICAL EXAMINATION:    BP 99/87 (BP Location: Left Arm, Patient Position: Sitting, Cuff Size: Large)   Pulse 91   Ht 5\' 5"  (1.651 m)   Wt 294 lb 12.8 oz (133.7 kg)   LMP 08/05/2018 (LMP Unknown)   BMI 49.06 kg/m     General appearance: alert, cooperative and appears stated age Abdomen: soft, non-tender; bowel sounds normal; no masses,  no organomegaly Lymph:  no inguinal LAD noted  Pelvic: External genitalia:  no lesions              Urethra:  normal appearing urethra with no masses, tenderness or lesions              Bartholins and Skenes: normal                 Vagina: normal appearing vagina with normal color and discharge, no lesions              Cervix: no lesions              Bimanual Exam:  Uterus:  normal size, contour, position, consistency, mobility, non-tender              Adnexa: no mass, fullness, tenderness  Chaperone, Hendricks Milo, CMA, was present for exam.  Assessment/Plan: 1. Right groin pain - this is definitively groin pain and she has a normal pelvic exam.  Possibly related to knee issues.  Will refer.  2. STD exposure - GC/Chl, trich testing obtained today  3. Vaginal discharge - BV and yeast testing also obtained   4. Chronic pain of right knee - Ambulatory referral to Orthopedic Surgery

## 2023-03-05 LAB — CERVICOVAGINAL ANCILLARY ONLY
Bacterial Vaginitis (gardnerella): NEGATIVE
Candida Glabrata: NEGATIVE
Candida Vaginitis: NEGATIVE
Chlamydia: NEGATIVE
Comment: NEGATIVE
Comment: NEGATIVE
Comment: NEGATIVE
Comment: NEGATIVE
Comment: NEGATIVE
Comment: NORMAL
Neisseria Gonorrhea: NEGATIVE
Trichomonas: NEGATIVE

## 2023-03-11 ENCOUNTER — Ambulatory Visit (INDEPENDENT_AMBULATORY_CARE_PROVIDER_SITE_OTHER): Payer: Commercial Managed Care - HMO | Admitting: Orthopaedic Surgery

## 2023-03-11 ENCOUNTER — Encounter: Payer: Self-pay | Admitting: Orthopaedic Surgery

## 2023-03-11 VITALS — Ht 65.0 in | Wt 297.0 lb

## 2023-03-11 DIAGNOSIS — M25561 Pain in right knee: Secondary | ICD-10-CM

## 2023-03-11 DIAGNOSIS — G8929 Other chronic pain: Secondary | ICD-10-CM

## 2023-03-11 NOTE — Progress Notes (Signed)
The patient is someone I am seeing for the first time but she actually saw my partner Dr. August Saucer in December of last year for her right knee.  She comes in today with continued right knee pain with locking and catching.  It does wake her from sleep at night.  She used to play a lot of tennis and now she cannot play tennis.  She is describing pain in the posterior aspect of her knee with weightbearing and with twisting and pivoting activities.  She also reports some stiffness as well.  Her main comorbidity is her BMI of 49.42.  On exam her right knee does have some slight valgus malalignment.  There are some global tenderness about the arc of motion of her knee.  There is a mild effusion as well.  There is a lot of posterior lateral pain and a positive McMurray's exam to the posterior lateral compartment of her knee.  She has now tried and failed conservative treatment for over 12 months as it relates to her right knee.  A MRI of this knee is warranted to assess the cartilage and to rule out a meniscal tear given the clinical exam findings.  We will see her back once we have the MRI of her right knee.

## 2023-03-12 ENCOUNTER — Other Ambulatory Visit: Payer: Self-pay

## 2023-03-12 DIAGNOSIS — G8929 Other chronic pain: Secondary | ICD-10-CM

## 2023-03-18 ENCOUNTER — Ambulatory Visit: Payer: Commercial Managed Care - HMO | Admitting: Orthopaedic Surgery

## 2023-04-14 ENCOUNTER — Encounter: Payer: Self-pay | Admitting: Orthopaedic Surgery

## 2023-04-20 ENCOUNTER — Encounter: Payer: Self-pay | Admitting: Orthopaedic Surgery

## 2023-04-25 ENCOUNTER — Ambulatory Visit
Admission: RE | Admit: 2023-04-25 | Discharge: 2023-04-25 | Disposition: A | Discharge status: Home / Self Care | Payer: Managed Care, Other (non HMO) | Source: Ambulatory Visit | Attending: Orthopaedic Surgery | Admitting: Orthopaedic Surgery

## 2023-04-25 DIAGNOSIS — G8929 Other chronic pain: Secondary | ICD-10-CM

## 2023-05-14 ENCOUNTER — Encounter (HOSPITAL_BASED_OUTPATIENT_CLINIC_OR_DEPARTMENT_OTHER): Payer: Self-pay | Admitting: Obstetrics & Gynecology

## 2023-05-14 ENCOUNTER — Ambulatory Visit (HOSPITAL_BASED_OUTPATIENT_CLINIC_OR_DEPARTMENT_OTHER): Payer: Managed Care, Other (non HMO) | Admitting: Obstetrics & Gynecology

## 2023-05-14 VITALS — BP 113/72 | HR 73 | Ht 64.5 in | Wt 303.2 lb

## 2023-05-14 DIAGNOSIS — D251 Intramural leiomyoma of uterus: Secondary | ICD-10-CM | POA: Diagnosis not present

## 2023-05-14 DIAGNOSIS — I10 Essential (primary) hypertension: Secondary | ICD-10-CM | POA: Diagnosis not present

## 2023-05-14 DIAGNOSIS — Z01419 Encounter for gynecological examination (general) (routine) without abnormal findings: Secondary | ICD-10-CM | POA: Diagnosis not present

## 2023-05-14 DIAGNOSIS — Z13820 Encounter for screening for osteoporosis: Secondary | ICD-10-CM

## 2023-05-14 NOTE — Progress Notes (Signed)
67 y.o. G73P1021 Divorced Black or [place] American female here for annual exam.  Still in relationship with same person.  This is good.  Denies vaginal bleeding.      Having some issues with knee.  Had MRI and is seeing Dr. Magnus Ivan.    Patient's last menstrual period was 08/05/2018 (lmp unknown).          Sexually active: yes Smoker:  no  Health Maintenance: Pap:  03/2021 History of abnormal Pap:  no MMG:  05/2022 Colonoscopy:  colonoscopy 2013, cologuard 04/2022 with Dr. Loreta Ave BMD:   ordered to do with mammogram Screening Labs: done with PCP   reports that she has never smoked. She has never used smokeless tobacco. She reports that she does not drink alcohol and does not use drugs.  Past Medical History:  Diagnosis Date   Fibroid    History of migraine    Hypertension    Morbid obesity (HCC)    Peptic ulcer disease 1998   normal EGD 2004    Past Surgical History:  Procedure Laterality Date   BLADDER SURGERY     Stretched   COLONOSCOPY  04/19/2012   Procedure: COLONOSCOPY;  Surgeon: Charna Elizabeth, MD;  Location: [facility];  Service: Endoscopy;  Laterality: N/A;   DILATATION & CURETTAGE/HYSTEROSCOPY WITH MYOSURE N/A 04/18/2019   Procedure: DILATATION & CURETTAGE/HYSTEROSCOPY WITH MYOSURE POLYP RESECTION;  Surgeon: Jerene Bears, MD;  [facility];  Service: Gynecology;  Laterality: N/A;   DILATION AND CURETTAGE OF UTERUS     with miscarriage   TIBIA FRACTURE SURGERY  1992   7 pins in tibia, 2 pins in knee cap    Current Outpatient Medications  Medication Sig Dispense Refill   acetaminophen (TYLENOL) 500 MG tablet Take 1,000 mg by mouth every 6 (six) hours as needed for mild pain or headache.     cholecalciferol (VITAMIN D) 25 MCG (1000 UT) tablet Take 1,000 Units by mouth daily. D3     Garlic 1000 MG CAPS Take 1,000 mg by mouth daily.     lisinopril-hydrochlorothiazide (ZESTORETIC) 10-12.5 MG tablet Take 1 tablet by mouth daily.     Multiple Vitamin (MULTIVITAMIN)  tablet Take 1 tablet by mouth daily.     vitamin C (ASCORBIC ACID) 500 MG tablet Take 500 mg by mouth daily.     No current facility-administered medications for this visit.    Family History  Problem Relation Age of Onset   Hypertension Mother    Diabetes Mellitus I Mother    Hypertension Father    Lupus Father    Prostate cancer Father    CAD Father        a. s/p CABG and valve surgery @ 21.   Valvular heart disease Father    Hypertension Brother    Diabetes Mellitus I Brother    Suicidality Neg Hx    Alcohol abuse Neg Hx    Anxiety disorder Neg Hx    Bipolar disorder Neg Hx    Depression Neg Hx    Drug abuse Neg Hx     ROS: Constitutional: negative Genitourinary:negative  Exam:   BP 113/72 (BP Location: Left Arm, Patient Position: Sitting, Cuff Size: Large)   Pulse 73   Ht 5' 4.5" (1.638 m)   Wt (!) 303 lb 3.2 oz (137.5 kg)   LMP 08/05/2018 (LMP Unknown)   BMI 51.24 kg/m   Height: 5' 4.5" (163.8 cm)  General appearance: alert, cooperative and appears stated age Head: Normocephalic, without obvious  abnormality, atraumatic Neck: no adenopathy, supple, symmetrical, trachea midline and thyroid normal to inspection and palpation Lungs: clear to auscultation bilaterally Breasts: normal appearance, no masses or tenderness Heart: regular rate and rhythm Abdomen: soft, non-tender; bowel sounds normal; no masses,  no organomegaly Extremities: extremities normal, atraumatic, no cyanosis or edema Skin: Skin color, texture, turgor normal. No rashes or lesions Lymph nodes: Cervical, supraclavicular, and axillary nodes normal. No abnormal inguinal nodes palpated Neurologic: Grossly normal   Pelvic: External genitalia:  no lesions              Urethra:  normal appearing urethra with no masses, tenderness or lesions              Bartholins and Skenes: normal                 Vagina: normal appearing vagina with normal color and no discharge, no lesions              Cervix:  no lesions              Pap taken: No. Bimanual Exam:  Uterus:  normal size, contour, position, consistency, mobility, non-tender              Adnexa: normal adnexa and no mass, fullness, tenderness               Rectovaginal: Confirms               Anus:  normal sphincter tone, no lesions  Chaperone, Ina Homes, CMA, was present for exam.  Assessment/Plan: 1. Well woman exam with routine gynecological exam - Pap smear 03/2021 - Mammogram 05/2022 - Colonoscopy in 2013 and cologuard negative 04/2022 - Bone mineral density ordered - lab work done with PCP, Dr. Orson Aloe - vaccines reviewed/updated  2. Osteoporosis screening - DG BONE DENSITY (DXA); Future  3. Primary hypertension - on lisinopril/HCTZ  4. Intramural leiomyoma of uterus  .

## 2023-06-10 ENCOUNTER — Encounter: Payer: Self-pay | Admitting: Orthopaedic Surgery

## 2023-06-10 ENCOUNTER — Ambulatory Visit (INDEPENDENT_AMBULATORY_CARE_PROVIDER_SITE_OTHER): Payer: Commercial Managed Care - HMO | Admitting: Orthopaedic Surgery

## 2023-06-10 VITALS — Ht 64.5 in | Wt 303.0 lb

## 2023-06-10 DIAGNOSIS — S83231A Complex tear of medial meniscus, current injury, right knee, initial encounter: Secondary | ICD-10-CM | POA: Insufficient documentation

## 2023-06-10 DIAGNOSIS — S83231D Complex tear of medial meniscus, current injury, right knee, subsequent encounter: Secondary | ICD-10-CM | POA: Diagnosis not present

## 2023-06-10 DIAGNOSIS — M1711 Unilateral primary osteoarthritis, right knee: Secondary | ICD-10-CM

## 2023-06-10 DIAGNOSIS — M25561 Pain in right knee: Secondary | ICD-10-CM | POA: Diagnosis not present

## 2023-06-10 DIAGNOSIS — G8929 Other chronic pain: Secondary | ICD-10-CM

## 2023-06-10 NOTE — Progress Notes (Signed)
The patient is a 67 year old female who comes in to go over a MRI of her right knee.  She has been having chronic right knee pain for some time now but also locking and catching.  She has been on a weight loss journey and is lost about 100 pounds.  Her BMI is 51.  Most of her pain is in the posterior medial aspect of her right knee.  There are some instability symptoms that she describes as well as locking and catching.  She does not have pain that wakes her up at night which is more consistent with osteoarthritis pain.  The MRI of her right knee does show a complex medial meniscal tear.  There are some areas of cartilage thinning in that area of her knee but not bone-on-bone wear or full-thickness cartilage loss on that aspect of the knee.  There is mucoid degeneration of the ACL.  Examination of her right knee shows posterior medial tenderness and a positive McMurray's sign to the posterior medial aspect of her right knee.  At this point we are recommending an arthroscopic intervention of her right knee with a partial medial meniscectomy.  She understands that any knee arthroscopy will not address arthritic aspects of her knee but I do feel this will help based on her clinical exam findings and radiographic findings.  She is continuing on a weight loss journey.  I did explain in detail what arthroscopic surgery involves and described the risk and benefits of the surgery.  We will work on getting this scheduled hopefully in the near future for right knee arthroscopy with a partial medial meniscectomy.  All questions and concerns were addressed and answered.

## 2023-06-12 NOTE — Patient Instructions (Signed)
SURGICAL WAITING ROOM VISITATION  Patients having surgery or a procedure may have no more than 2 support people in the waiting area - these visitors may rotate.    Children under the age of 63 must have an adult with them who is not the patient.  Due to an increase in RSV and influenza rates and associated hospitalizations, children ages 89 and under may not visit patients in [facility] hospitals.  If the patient needs to stay at the hospital during part of their recovery, the visitor guidelines for inpatient rooms apply. Pre-op nurse will coordinate an appropriate time for 1 support person to accompany patient in pre-op.  This support person may not rotate.    Please refer to the [facility] website for the visitor guidelines for Inpatients (after your surgery is over and you are in a regular room).       Your procedure is scheduled on: Thursday, Dec. 19, 2024   Report to [facility] Main Entrance    Report to admitting at 9:00 AM   Call this number if you have problems the morning of surgery [phone]   Do not eat food :After Midnight.   After Midnight you may have the following liquids until 8:15 AM DAY OF SURGERY  Water Non-Citrus Juices (without pulp, NO RED-Apple, White grape, White cranberry) Black Coffee (NO MILK/CREAM OR CREAMERS, sugar ok)  Clear Tea (NO MILK/CREAM OR CREAMERS, sugar ok) regular and decaf                             Plain Jell-O (NO RED)                                           Fruit ices (not with fruit pulp, NO RED)                                     Popsicles (NO RED)                                                               Sports drinks like Gatorade (NO RED)                    The day of surgery:  Drink ONE (1) Pre-Surgery Clear Ensure at 8:15 AM the morning of surgery. Drink in one sitting. Do not sip.  This drink was given to you during your hospital  pre-op appointment visit. Nothing else to drink after completing the   Pre-Surgery Clear Ensure.          If you have questions, please contact your surgeon's office.   FOLLOW BOWEL PREP AND ANY ADDITIONAL PRE OP INSTRUCTIONS YOU RECEIVED FROM YOUR SURGEON'S OFFICE!!!     Oral Hygiene is also important to reduce your risk of infection.                                    Remember - BRUSH YOUR TEETH THE MORNING  OF SURGERY WITH YOUR REGULAR TOOTHPASTE  DENTURES WILL BE REMOVED PRIOR TO SURGERY PLEASE DO NOT APPLY "Poly grip" OR ADHESIVES!!!   Do NOT smoke after Midnight   Stop all vitamins and herbal supplements 7 days before surgery.   Take these medicines the morning of surgery with A SIP OF WATER: None  DO NOT TAKE ANY ORAL DIABETIC MEDICATIONS DAY OF YOUR SURGERY  Bring CPAP mask and tubing day of surgery.                              You may not have any metal on your body including hair pins, jewelry, and body piercing             Do not wear make-up, lotions, powders, perfumes/cologne, or deodorant  Do not wear nail polish including gel and S&S, artificial/acrylic nails, or any other type of covering on natural nails including finger and toenails. If you have artificial nails, gel coating, etc. that needs to be removed by a nail salon please have this removed prior to surgery or surgery may need to be canceled/ delayed if the surgeon/ anesthesia feels like they are unable to be safely monitored.   Do not shave  48 hours prior to surgery.               Men may shave face and neck.   Do not bring valuables to the hospital. [facility] IS NOT             RESPONSIBLE   FOR VALUABLES.   Contacts, glasses, dentures or bridgework may not be worn into surgery.   Bring small overnight bag day of surgery.   DO NOT BRING YOUR HOME MEDICATIONS TO THE HOSPITAL. PHARMACY WILL DISPENSE MEDICATIONS LISTED ON YOUR MEDICATION LIST TO YOU DURING YOUR ADMISSION IN THE HOSPITAL!    Patients discharged on the day of surgery will not be allowed to drive home.   Someone NEEDS to stay with you for the first 24 hours after anesthesia.   Special Instructions: Bring a copy of your healthcare power of attorney and living will documents the day of surgery if you haven't scanned them before.              Please read over the following fact sheets you were given: IF YOU HAVE QUESTIONS ABOUT YOUR PRE-OP INSTRUCTIONS PLEASE CALL [phone]   If you received a COVID test during your pre-op visit  it is requested that you wear a mask when out in public, stay away from anyone that may not be feeling well and notify your surgeon if you develop symptoms. If you test positive for Covid or have been in contact with anyone that has tested positive in the last 10 days please notify you surgeon.    [facility] - Preparing for Surgery Before surgery, you can play an important role.  Because skin is not sterile, your skin needs to be as free of germs as possible.  You can reduce the number of germs on your skin by washing with CHG (chlorahexidine gluconate) soap before surgery.  CHG is an antiseptic cleaner which kills germs and bonds with the skin to continue killing germs even after washing. Please DO NOT use if you have an allergy to CHG or antibacterial soaps.  If your skin becomes reddened/irritated stop using the CHG and inform your nurse when you arrive at Short Stay. Do not shave (including legs  and underarms) for at least 48 hours prior to the first CHG shower.  You may shave your face/neck.  Please follow these instructions carefully:  1.  Shower with CHG Soap the night before surgery and the  morning of surgery.  2.  If you choose to wash your hair, wash your hair first as usual with your normal  shampoo.  3.  After you shampoo, rinse your hair and body thoroughly to remove the shampoo.                             4.  Use CHG as you would any other liquid soap.  You can apply chg directly to the skin and wash.  Gently with a scrungie or clean washcloth.  5.  Apply  the CHG Soap to your body ONLY FROM THE NECK DOWN.   Do   not use on face/ open                           Wound or open sores. Avoid contact with eyes, ears mouth and   genitals (private parts).                       Wash face,  Genitals (private parts) with your normal soap.             6.  Wash thoroughly, paying special attention to the area where your    surgery  will be performed.  7.  Thoroughly rinse your body with warm water from the neck down.  8.  DO NOT shower/wash with your normal soap after using and rinsing off the CHG Soap.                9.  Pat yourself dry with a clean towel.            10.  Wear clean pajamas.            11.  Place clean sheets on your bed the night of your first shower and do not  sleep with pets. Day of Surgery : Do not apply any lotions/deodorants the morning of surgery.  Please wear clean clothes to the hospital/surgery center.  FAILURE TO FOLLOW THESE INSTRUCTIONS MAY RESULT IN THE CANCELLATION OF YOUR SURGERY  PATIENT SIGNATURE_________________________________  NURSE SIGNATURE__________________________________  ________________________________________________________________________  Jacqueline Mcclure (Watch this video at home: [email or web address])  An incentive spirometer is a tool that can help keep your lungs clear and active. This tool measures how well you are filling your lungs with each breath. Taking long deep breaths may help reverse or decrease the chance of developing breathing (pulmonary) problems (especially infection) following: A long period of time when you are unable to move or be active. BEFORE THE PROCEDURE  If the spirometer includes an indicator to show your best effort, your nurse or respiratory therapist will set it to a desired goal. If possible, sit up straight or lean slightly forward. Try not to slouch. Hold the incentive spirometer in an upright position. INSTRUCTIONS FOR USE  Sit on the  edge of your bed if possible, or sit up as far as you can in bed or on a chair. Hold the incentive spirometer in an upright position. Breathe out normally. Place the mouthpiece in your mouth and seal your lips tightly around it. Breathe in slowly and as deeply as possible, raising the piston or  the ball toward the top of the column. Hold your breath for 3-5 seconds or for as long as possible. Allow the piston or ball to fall to the bottom of the column. Remove the mouthpiece from your mouth and breathe out normally. Rest for a few seconds and repeat Steps 1 through 7 at least 10 times every 1-2 hours when you are awake. Take your time and take a few normal breaths between deep breaths. The spirometer may include an indicator to show your best effort. Use the indicator as a goal to work toward during each repetition. After each set of 10 deep breaths, practice coughing to be sure your lungs are clear. If you have an incision (the cut made at the time of surgery), support your incision when coughing by placing a pillow or rolled up towels firmly against it. Once you are able to get out of bed, walk around indoors and cough well. You may stop using the incentive spirometer when instructed by your caregiver.  RISKS AND COMPLICATIONS Take your time so you do not get dizzy or light-headed. If you are in pain, you may need to take or ask for pain medication before doing incentive spirometry. It is harder to take a deep breath if you are having pain. AFTER USE Rest and breathe slowly and easily. It can be helpful to keep track of a log of your progress. Your caregiver can provide you with a simple table to help with this. If you are using the spirometer at home, follow these instructions: SEEK MEDICAL CARE IF:  You are having difficultly using the spirometer. You have trouble using the spirometer as often as instructed. Your pain medication is not giving enough relief while using the spirometer. You  develop fever of 100.5 F (38.1 C) or higher. SEEK IMMEDIATE MEDICAL CARE IF:  You cough up bloody sputum that had not been present before. You develop fever of 102 F (38.9 C) or greater. You develop worsening pain at or near the incision site. MAKE SURE YOU:  Understand these instructions. Will watch your condition. Will get help right away if you are not doing well or get worse. Document Released: 10/27/2006 Document Revised: 09/08/2011 Document Reviewed: 12/28/2006 [facility] Patient Information 2014 [place], [place].

## 2023-06-15 ENCOUNTER — Other Ambulatory Visit: Payer: Self-pay

## 2023-06-15 ENCOUNTER — Encounter (HOSPITAL_COMMUNITY)
Admission: RE | Admit: 2023-06-15 | Discharge: 2023-06-15 | Disposition: A | Discharge status: Home / Self Care | Payer: Commercial Managed Care - HMO | Source: Ambulatory Visit | Attending: Orthopaedic Surgery

## 2023-06-15 ENCOUNTER — Encounter (HOSPITAL_COMMUNITY): Payer: Self-pay

## 2023-06-15 VITALS — BP 137/90 | HR 79 | Temp 98.1°F | Resp 16 | Ht 65.0 in | Wt 307.0 lb

## 2023-06-15 DIAGNOSIS — I10 Essential (primary) hypertension: Secondary | ICD-10-CM | POA: Diagnosis not present

## 2023-06-15 DIAGNOSIS — Z01818 Encounter for other preprocedural examination: Secondary | ICD-10-CM | POA: Insufficient documentation

## 2023-06-15 HISTORY — DX: Pneumonia, unspecified organism: J18.9

## 2023-06-15 HISTORY — DX: Prediabetes: R73.03

## 2023-06-15 LAB — BASIC METABOLIC PANEL
Anion gap: 8 (ref 5–15)
BUN: 11 mg/dL (ref 8–23)
CO2: 28 mmol/L (ref 22–32)
Calcium: 9.2 mg/dL (ref 8.9–10.3)
Chloride: 102 mmol/L (ref 98–111)
Creatinine, Ser: 0.97 mg/dL (ref 0.44–1.00)
GFR, Estimated: >60 mL/min (ref >60)
Glucose, Bld: 103 mg/dL — ABNORMAL HIGH (ref 70–99)
Potassium: 3.5 mmol/L (ref 3.5–5.1)
Sodium: 138 mmol/L (ref 135–145)

## 2023-06-15 LAB — CBC
HCT: 39 % (ref 36.0–46.0)
Hemoglobin: 12.3 g/dL (ref 12.0–15.0)
MCH: 26.9 pg (ref 26.0–34.0)
MCHC: 31.5 g/dL (ref 30.0–36.0)
MCV: 85.2 fL (ref 80.0–100.0)
Platelets: 266 10*3/uL (ref 150–400)
RBC: 4.58 MIL/uL (ref 3.87–5.11)
RDW: 14.3 % (ref 11.5–15.5)
WBC: 8 10*3/uL (ref 4.0–10.5)
nRBC: 0 % (ref 0.0–0.2)

## 2023-06-15 NOTE — Progress Notes (Addendum)
PCP - Eleanora Neighbor, MD Cardiologist - Nicolasa Ducking , NP . Last seen over 5 years just for BP medicine questions  , No longer sees   PPM/ICD -  Device Orders -  Rep Notified -   Chest x-ray -  EKG - done at preop Stress Test -  ECHO -  Cardiac Cath -   Sleep Study - 04-21-14 epic CPAP -   Fasting Blood Sugar -  Checks Blood Sugar _____ times a day  Blood Thinner Instructions: Aspirin Instructions:  ERAS Protcol - PRE-SURGERY Ensure    COVID vaccine -yes  Activity--Able to climb a flight of stairs with no CP or SOB  Anesthesia review: HTN,   Patient denies shortness of breath, fever, cough and chest pain at PAT appointment   All instructions explained to the patient, with a verbal understanding of the material. Patient agrees to go over the instructions while at home for a better understanding. Patient also instructed to self quarantine after being tested for COVID-19. The opportunity to ask questions was provided.

## 2023-06-17 NOTE — H&P (Signed)
Jacqueline Mcclure is an 67 y.o. female.   Chief Complaint: Right knee pain with locking and catching HPI: The patient is a 67 year old female who has been dealing with right knee pain with locking and catching of the right knee for some time now.  It is slowly gotten worse and she has failed all forms of conservative treatment.  A MRI of her right knee obtained recently does show an oblique tear of the posterior horn to mid body of the medial meniscus.  There is cartilage thinning and some areas of significant cartilage loss but most of her symptoms seem to be related to the meniscal tear.  She is someone who is morbidly obese with a BMI of over 50 and this has contributed to some of her knee symptoms as well.  However given the MRI findings and the continued mechanical symptoms, arthroscopic surgery has been warranted and recommended at this point to deal with her right knee issues.  Past Medical History:  Diagnosis Date   Fibroid    History of migraine    Hypertension    Morbid obesity (HCC)    Peptic ulcer disease 1998   normal EGD 2004   Pneumonia    Pre-diabetes     Past Surgical History:  Procedure Laterality Date   BLADDER SURGERY     Stretched   COLONOSCOPY  04/19/2012   Procedure: COLONOSCOPY;  Surgeon: Charna Elizabeth, MD;  Location: [facility];  Service: Endoscopy;  Laterality: N/A;   DILATATION & CURETTAGE/HYSTEROSCOPY WITH MYOSURE N/A 04/18/2019   Procedure: DILATATION & CURETTAGE/HYSTEROSCOPY WITH MYOSURE POLYP RESECTION;  Surgeon: Jerene Bears, MD;  Location: [facility] OR;  Service: Gynecology;  Laterality: N/A;   DILATION AND CURETTAGE OF UTERUS     with miscarriage   TIBIA FRACTURE SURGERY  1992   7 pins in tibia, 2 pins in knee cap    Family History  Problem Relation Age of Onset   Hypertension Mother    Diabetes Mellitus I Mother    Hypertension Father    Lupus Father    Prostate cancer Father    CAD Father        a. s/p CABG and valve surgery @ 39.    Valvular heart disease Father    Hypertension Brother    Diabetes Mellitus I Brother    Suicidality Neg Hx    Alcohol abuse Neg Hx    Anxiety disorder Neg Hx    Bipolar disorder Neg Hx    Depression Neg Hx    Drug abuse Neg Hx    Social History:  reports that she has never smoked. She has never used smokeless tobacco. She reports that she does not drink alcohol and does not use drugs.  Allergies: No Known Allergies  No medications prior to admission.    No results found for this or any previous visit (from the past 48 hours). No results found.  Review of Systems  Last menstrual period 08/05/2018. Physical Exam Vitals reviewed.  Constitutional:      Appearance: Normal appearance. She is obese.  HENT:     Head: Normocephalic and atraumatic.  Eyes:     Extraocular Movements: Extraocular movements intact.     Pupils: Pupils are equal, round, and reactive to light.  Cardiovascular:     Rate and Rhythm: Normal rate.     Pulses: Normal pulses.  Pulmonary:     Effort: Pulmonary effort is normal.     Breath sounds: Normal breath sounds.  Abdominal:  Palpations: Abdomen is soft.  Musculoskeletal:     Cervical back: Normal range of motion and neck supple.     Left knee: Effusion, bony tenderness and crepitus present. Tenderness present over the medial joint line. Abnormal meniscus.  Neurological:     Mental Status: She is alert and oriented to person, place, and time.  Psychiatric:        Behavior: Behavior normal.      Assessment/Plan Right knee with complex medial meniscal tear  The plan is to proceed to surgery as an outpatient for a right knee arthroscopy and partial medial meniscectomy.  The risks and benefits of this type of surgery have been discussed in details with the patient.  Kathryne Hitch, MD 06/17/2023, 10:14 PM

## 2023-06-18 ENCOUNTER — Ambulatory Visit (HOSPITAL_BASED_OUTPATIENT_CLINIC_OR_DEPARTMENT_OTHER): Payer: Self-pay | Admitting: Certified Registered"

## 2023-06-18 ENCOUNTER — Other Ambulatory Visit: Payer: Self-pay

## 2023-06-18 ENCOUNTER — Encounter (HOSPITAL_COMMUNITY): Payer: Self-pay | Admitting: Orthopaedic Surgery

## 2023-06-18 ENCOUNTER — Ambulatory Visit (HOSPITAL_COMMUNITY)
Admission: RE | Admit: 2023-06-18 | Discharge: 2023-06-18 | Disposition: A | Discharge status: Home / Self Care | Payer: Commercial Managed Care - HMO | Source: Ambulatory Visit | Attending: Orthopaedic Surgery | Admitting: Orthopaedic Surgery

## 2023-06-18 ENCOUNTER — Other Ambulatory Visit: Payer: Self-pay | Admitting: Orthopaedic Surgery

## 2023-06-18 ENCOUNTER — Encounter (HOSPITAL_COMMUNITY)
Admission: RE | Disposition: A | Discharge status: Home / Self Care | Payer: Self-pay | Source: Ambulatory Visit | Attending: Orthopaedic Surgery

## 2023-06-18 ENCOUNTER — Ambulatory Visit (HOSPITAL_COMMUNITY): Payer: Self-pay | Admitting: Certified Registered"

## 2023-06-18 DIAGNOSIS — I10 Essential (primary) hypertension: Secondary | ICD-10-CM | POA: Diagnosis not present

## 2023-06-18 DIAGNOSIS — S83231A Complex tear of medial meniscus, current injury, right knee, initial encounter: Secondary | ICD-10-CM

## 2023-06-18 DIAGNOSIS — S83231D Complex tear of medial meniscus, current injury, right knee, subsequent encounter: Secondary | ICD-10-CM

## 2023-06-18 DIAGNOSIS — Z6841 Body Mass Index (BMI) 40.0 and over, adult: Secondary | ICD-10-CM | POA: Diagnosis not present

## 2023-06-18 DIAGNOSIS — X58XXXA Exposure to other specified factors, initial encounter: Secondary | ICD-10-CM | POA: Insufficient documentation

## 2023-06-18 HISTORY — PX: KNEE ARTHROSCOPY WITH MEDIAL MENISECTOMY: SHX5651

## 2023-06-18 LAB — GLUCOSE, CAPILLARY: Glucose-Capillary: 94 mg/dL (ref 70–99)

## 2023-06-18 SURGERY — ARTHROSCOPY, KNEE, WITH MEDIAL MENISCECTOMY
Anesthesia: General | Site: Knee | Laterality: Right

## 2023-06-18 MED ORDER — CEFAZOLIN IN SODIUM CHLORIDE 3-0.9 GM/100ML-% IV SOLN
3.0000 g | INTRAVENOUS | Status: AC
  Administered 2023-06-18: 3 g via INTRAVENOUS
  Filled 2023-06-18: qty 100

## 2023-06-18 MED ORDER — DEXAMETHASONE SODIUM PHOSPHATE 10 MG/ML IJ SOLN
INTRAMUSCULAR | Status: DC | PRN
  Administered 2023-06-18: 4 mg via INTRAVENOUS

## 2023-06-18 MED ORDER — PROPOFOL 10 MG/ML IV BOLUS
INTRAVENOUS | Status: AC
  Filled 2023-06-18: qty 20

## 2023-06-18 MED ORDER — OXYCODONE HCL 5 MG PO TABS: 5.0000 mg | ORAL_TABLET | Freq: Once | ORAL | Status: DC | PRN

## 2023-06-18 MED ORDER — LIDOCAINE HCL (PF) 2 % IJ SOLN
INTRAMUSCULAR | Status: AC
  Filled 2023-06-18: qty 5

## 2023-06-18 MED ORDER — FENTANYL CITRATE (PF) 100 MCG/2ML IJ SOLN
INTRAMUSCULAR | Status: DC | PRN
  Administered 2023-06-18: 50 ug via INTRAVENOUS
  Administered 2023-06-18 (×3): 25 ug via INTRAVENOUS

## 2023-06-18 MED ORDER — MIDAZOLAM HCL 2 MG/2ML IJ SOLN: 2.0000 mg | Freq: Once | INTRAMUSCULAR | Status: DC

## 2023-06-18 MED ORDER — SODIUM CHLORIDE 0.9 % IR SOLN
Status: DC | PRN
  Administered 2023-06-18: 4000 mL

## 2023-06-18 MED ORDER — ONDANSETRON HCL 4 MG/2ML IJ SOLN: 4.0000 mg | Freq: Once | INTRAMUSCULAR | Status: DC | PRN

## 2023-06-18 MED ORDER — CHLORHEXIDINE GLUCONATE 0.12 % MT SOLN
15.0000 mL | Freq: Once | OROMUCOSAL | Status: AC
  Administered 2023-06-18: 15 mL via OROMUCOSAL

## 2023-06-18 MED ORDER — BUPIVACAINE-EPINEPHRINE 0.25% -1:200000 IJ SOLN
INTRAMUSCULAR | Status: AC
  Filled 2023-06-18: qty 1

## 2023-06-18 MED ORDER — ONDANSETRON HCL 4 MG/2ML IJ SOLN
INTRAMUSCULAR | Status: DC | PRN
  Administered 2023-06-18: 4 mg via INTRAVENOUS

## 2023-06-18 MED ORDER — PROPOFOL 10 MG/ML IV BOLUS
INTRAVENOUS | Status: DC | PRN
  Administered 2023-06-18: 50 mg via INTRAVENOUS
  Administered 2023-06-18: 150 mg via INTRAVENOUS

## 2023-06-18 MED ORDER — ONDANSETRON HCL 4 MG/2ML IJ SOLN
4.0000 mg | Freq: Once | INTRAMUSCULAR | Status: AC
Start: 2023-06-18 — End: 2023-06-18
  Administered 2023-06-18: 4 mg via INTRAVENOUS

## 2023-06-18 MED ORDER — KETOROLAC TROMETHAMINE 15 MG/ML IJ SOLN
15.0000 mg | Freq: Once | INTRAMUSCULAR | Status: AC
Start: 2023-06-18 — End: 2023-06-18
  Administered 2023-06-18: 15 mg via INTRAVENOUS

## 2023-06-18 MED ORDER — FENTANYL CITRATE PF 50 MCG/ML IJ SOSY: 25.0000 ug | PREFILLED_SYRINGE | INTRAMUSCULAR | Status: DC | PRN

## 2023-06-18 MED ORDER — DEXAMETHASONE SODIUM PHOSPHATE 10 MG/ML IJ SOLN
INTRAMUSCULAR | Status: AC
  Filled 2023-06-18: qty 1

## 2023-06-18 MED ORDER — FENTANYL CITRATE (PF) 100 MCG/2ML IJ SOLN
INTRAMUSCULAR | Status: AC
  Filled 2023-06-18: qty 2

## 2023-06-18 MED ORDER — LIDOCAINE 2% (20 MG/ML) 5 ML SYRINGE
INTRAMUSCULAR | Status: DC | PRN
  Administered 2023-06-18: 100 mg via INTRAVENOUS

## 2023-06-18 MED ORDER — ACETAMINOPHEN 500 MG PO TABS
1000.0000 mg | ORAL_TABLET | Freq: Once | ORAL | Status: AC
  Administered 2023-06-18: 1000 mg via ORAL

## 2023-06-18 MED ORDER — ORAL CARE MOUTH RINSE: 15.0000 mL | Freq: Once | OROMUCOSAL | Status: AC

## 2023-06-18 MED ORDER — MIDAZOLAM HCL 2 MG/2ML IJ SOLN
INTRAMUSCULAR | Status: DC | PRN
  Administered 2023-06-18: 2 mg via INTRAVENOUS

## 2023-06-18 MED ORDER — LACTATED RINGERS IV SOLN: INTRAVENOUS | Status: DC

## 2023-06-18 MED ORDER — OXYCODONE HCL 5 MG/5ML PO SOLN: 5.0000 mg | Freq: Once | ORAL | Status: DC | PRN

## 2023-06-18 MED ORDER — BUPIVACAINE-EPINEPHRINE (PF) 0.5% -1:200000 IJ SOLN
INTRAMUSCULAR | Status: AC
  Filled 2023-06-18: qty 30

## 2023-06-18 MED ORDER — BUPIVACAINE HCL (PF) 0.5 % IJ SOLN
INTRAMUSCULAR | Status: DC | PRN
  Administered 2023-06-18: 18 mL

## 2023-06-18 MED ORDER — ONDANSETRON HCL 4 MG/2ML IJ SOLN
INTRAMUSCULAR | Status: AC
  Filled 2023-06-18: qty 2

## 2023-06-18 MED ORDER — ACETAMINOPHEN 500 MG PO TABS
ORAL_TABLET | ORAL | Status: AC
  Filled 2023-06-18: qty 2

## 2023-06-18 MED ORDER — MIDAZOLAM HCL 2 MG/2ML IJ SOLN
INTRAMUSCULAR | Status: AC
  Filled 2023-06-18: qty 2

## 2023-06-18 MED ORDER — HYDROCODONE-ACETAMINOPHEN 5-325 MG PO TABS: 1.0000 | ORAL_TABLET | Freq: Four times a day (QID) | ORAL | 0 refills | Status: AC | PRN

## 2023-06-18 MED ORDER — ACETAMINOPHEN 10 MG/ML IV SOLN: 1000.0000 mg | Freq: Once | INTRAVENOUS | Status: DC | PRN

## 2023-06-18 MED ORDER — KETOROLAC TROMETHAMINE 15 MG/ML IJ SOLN
INTRAMUSCULAR | Status: AC
  Filled 2023-06-18: qty 1

## 2023-06-18 MED ORDER — FENTANYL CITRATE PF 50 MCG/ML IJ SOSY: 100.0000 ug | PREFILLED_SYRINGE | Freq: Once | INTRAMUSCULAR | Status: DC

## 2023-06-18 MED ORDER — MORPHINE SULFATE (PF) 4 MG/ML IV SOLN
INTRAVENOUS | Status: AC
  Filled 2023-06-18: qty 1

## 2023-06-18 MED ORDER — ONDANSETRON HCL 4 MG/2ML IJ SOLN
INTRAMUSCULAR | Status: AC
Start: 2023-06-18 — End: ?
  Filled 2023-06-18: qty 2

## 2023-06-18 SURGICAL SUPPLY — 23 items
BAG COUNTER SPONGE SURGICOUNT (BAG) IMPLANT
BLADE SHAVER TORPEDO 4X13 (MISCELLANEOUS) IMPLANT
BNDG ELASTIC 6INX 5YD STR LF (GAUZE/BANDAGES/DRESSINGS) ×1 IMPLANT
COVER SURGICAL LIGHT HANDLE (MISCELLANEOUS) ×1 IMPLANT
DRAPE ARTHROSCOPY W/POUCH 114 (DRAPES) IMPLANT
DRAPE U-SHAPE 47X51 STRL (DRAPES) ×1 IMPLANT
DURAPREP 26ML APPLICATOR (WOUND CARE) ×1 IMPLANT
GAUZE 4X4 16PLY ~~LOC~~+RFID DBL (SPONGE) ×1 IMPLANT
GAUZE PAD ABD 8X10 STRL (GAUZE/BANDAGES/DRESSINGS) ×1 IMPLANT
GAUZE SPONGE 4X4 12PLY STRL (GAUZE/BANDAGES/DRESSINGS) ×1 IMPLANT
GAUZE XEROFORM 1X8 LF (GAUZE/BANDAGES/DRESSINGS) ×1 IMPLANT
GLOVE BIO SURGEON STRL SZ7.5 (GLOVE) ×1 IMPLANT
GLOVE BIOGEL PI IND STRL 8 (GLOVE) ×2 IMPLANT
GLOVE ECLIPSE 8.0 STRL XLNG CF (GLOVE) ×1 IMPLANT
GOWN STRL REUS W/ TWL XL LVL3 (GOWN DISPOSABLE) ×2 IMPLANT
KIT TURNOVER KIT A (KITS) IMPLANT
MANIFOLD NEPTUNE II (INSTRUMENTS) ×1 IMPLANT
PACK ARTHROSCOPY WL (CUSTOM PROCEDURE TRAY) ×1 IMPLANT
PADDING CAST COTTON 6X4 STRL (CAST SUPPLIES) ×1 IMPLANT
PROTECTOR NERVE ULNAR (MISCELLANEOUS) ×1 IMPLANT
SUT ETHILON 4 0 PS 2 18 (SUTURE) ×1 IMPLANT
TUBING ARTHROSCOPY IRRIG 16FT (MISCELLANEOUS) ×1 IMPLANT
WRAP KNEE MAXI GEL POST OP (GAUZE/BANDAGES/DRESSINGS) ×1 IMPLANT

## 2023-06-18 NOTE — Anesthesia Procedure Notes (Signed)
Procedure Name: LMA Insertion Date/Time: 06/18/2023 11:40 AM  Performed by: Sindy Guadeloupe, CRNAPre-anesthesia Checklist: Patient identified, Emergency Drugs available, Suction available, Patient being monitored and Timeout performed Patient Re-evaluated:Patient Re-evaluated prior to induction Oxygen Delivery Method: Circle system utilized Preoxygenation: Pre-oxygenation with 100% oxygen Induction Type: IV induction Ventilation: Mask ventilation without difficulty LMA: LMA inserted LMA Size: 4.0 Number of attempts: 1 Tube secured with: Tape Dental Injury: Teeth and Oropharynx as per pre-operative assessment

## 2023-06-18 NOTE — Op Note (Signed)
Operative Note  Date of operation: 06/18/2023 Preoperative diagnosis: Right knee complex medial meniscal tear Postoperative diagnosis: Same  Procedure: Right knee arthroscopy with partial medial meniscectomy  Findings: Posterior horn to mid body medial meniscal tear, grade IV chondromalacia medial femoral condyle  Surgeon: Vanita Panda. Magnus Ivan, MD  Anesthesia: #1 General, #2 local EBL: Minimal Antibiotics: IV Ancef Complications: None  Indications: The patient is an active 67 year old female with a known medial meniscus tear of her right knee.  She also has significant cartilage wear in that knee but is not a candidate for knee replacement surgery given her BMI of over 50.  However she is mainly having locking catching with her knee and has a symptomatic medial meniscal tear.  At this point we recommended an arthroscopic intervention to address the meniscal tear of her knee.  She understands this will not address the arthritic aspects of her knee but will help with the meniscal issue.  She has tried and failed all forms of conservative treatment.  The risk and benefits of surgery been discussed in detail and informed consent has been obtained.  Procedure description: After informed consent was obtained and the appropriate right knee was marked, the patient was brought to the operating room and placed upon the operative table.  General anesthesia been obtained.  The right thigh, knee, leg and ankle were prepped and draped with DuraPrep and sterile drapes including a sterile stockinette.  With the bed raised and a lateral leg close utilized the right operative knee was flexed off the side of a table.  A timeout was called and she is benefits correct patient the correct right knee.  We then made anterior lateral arthroscopy portal and inserted a cane in the knee.  We placed the camera in the knee and went to the medial side of her knee and made an anterior medial portal.  We found a wide area  of grade 3 grade IV chondromalacia over the weightbearing surface the medial femoral condyle.  Using arthroscopic shaver we did debride this back to a stable margin.  We did find a posterior horn to mid body medial meniscal tear.  We were able to use an arthroscopic shaver and up cutting biter to perform a partial medial meniscectomy fortunately then her with still decent amount of meniscal tissue.  The ACL was then assessed and found to be intact.  The lateral compartment of her knee was assessed with the knee in a figure-of-four position.  There was some degenerative tearing of the lateral meniscus that we debrided back to a stable margin and there was some areas of grade III-IV chondromalacia in the lateral compartment of the knee.  Following assessed the patellofemoral joint and found some chondromalacia in that area that was only mild.  We debrided that area as well.  We then allow fluid lavage the knee and drained all fluid from the knee.  The portal sites were closed with interrupted nylon suture.  We inserted Marcaine with morphine into the knee joint itself and the portal sites.  Well-padded sterile dressings applied.  The patient was awakened, extubated and taken recovery in stable condition.

## 2023-06-18 NOTE — Interval H&P Note (Signed)
History and Physical Interval Note: The patient understands that she is here today for right knee arthroscopy to treat her right knee meniscal tear.  There has been no acute or interval change in her medical status.  The risks and benefits of surgery been discussed in detail and informed consent was been obtained.  The right operative knee has been marked.  06/18/2023 10:53 AM  Jacqueline Mcclure  has presented today for surgery, with the diagnosis of right knee medial meniscal tear.  The various methods of treatment have been discussed with the patient and family. After consideration of risks, benefits and other options for treatment, the patient has consented to  Procedure(s): RIGHT KNEE ARTHROSCOPY WITH PARTIAL MEDIAL MENISCECTOMY (Right) as a surgical intervention.  The patient's history has been reviewed, patient examined, no change in status, stable for surgery.  I have reviewed the patient's chart and labs.  Questions were answered to the patient's satisfaction.     Kathryne Hitch

## 2023-06-18 NOTE — Transfer of Care (Signed)
Immediate Anesthesia Transfer of Care Note  Patient: Jacqueline Mcclure  Procedure(s) Performed: RIGHT KNEE ARTHROSCOPY WITH PARTIAL MEDIAL MENISCECTOMY (Right: Knee)  Patient Location: PACU  Anesthesia Type:General  Level of Consciousness: awake, drowsy, and patient cooperative  Airway & Oxygen Therapy: Patient Spontanous Breathing and Patient connected to face mask oxygen  Post-op Assessment: Report given to RN and Post -op Vital signs reviewed and stable  Post vital signs: Reviewed and stable  Last Vitals:  Vitals Value Taken Time  BP 160/88 06/18/23 1230  Temp    Pulse 97 06/18/23 1231  Resp 16 06/18/23 1231  SpO2 100 % 06/18/23 1231  Vitals shown include unfiled device data.  Last Pain:  Vitals:   06/18/23 0922  TempSrc:   PainSc: 0-No pain         Complications: No notable events documented.

## 2023-06-18 NOTE — Anesthesia Postprocedure Evaluation (Signed)
Anesthesia Post Note  Patient: Jacqueline Mcclure  Procedure(s) Performed: RIGHT KNEE ARTHROSCOPY WITH PARTIAL MEDIAL MENISCECTOMY (Right: Knee)     Patient location during evaluation: PACU Anesthesia Type: General Level of consciousness: awake and alert Pain management: pain level controlled Vital Signs Assessment: post-procedure vital signs reviewed and stable Respiratory status: spontaneous breathing, nonlabored ventilation, respiratory function stable and patient connected to nasal cannula oxygen Cardiovascular status: blood pressure returned to baseline and stable Postop Assessment: no apparent nausea or vomiting Anesthetic complications: no   No notable events documented.  Last Vitals:  Vitals:   06/18/23 0912 06/18/23 1230  BP: (!) 139/91 (!) 160/88  Pulse: 83 (!) 102  Resp: 18 17  Temp: 37 C (!) 36.1 C  SpO2: 98% 100%    Last Pain:  Vitals:   06/18/23 1230  TempSrc:   PainSc: 0-No pain                 Mariann Barter

## 2023-06-18 NOTE — Discharge Instructions (Signed)
Do expect right knee swelling - ice and elevation intermittently throughout the day as needed. You may put all of your weight on your right knee as comfort allows. Do increase her activities as comfort allows. You can leave the dressing on your right knee for the next 1 to 2 days.  Once you remove the dressing you can get your incisions wet in the shower after that standpoint on a daily basis. After each shower, place small Band-Aids over your incisions daily. Do expect some drainage from the incisions.

## 2023-06-18 NOTE — Anesthesia Preprocedure Evaluation (Signed)
Anesthesia Evaluation  Patient identified by MRN, date of birth, ID band Patient awake    Reviewed: Allergy & Precautions, NPO status , Patient's Chart, lab work & pertinent test results, reviewed documented beta blocker date and time   History of Anesthesia Complications Negative for: history of anesthetic complications  Airway Mallampati: III  TM Distance: >3 FB     Dental  (+) Partial Lower   Pulmonary neg shortness of breath, pneumonia, neg COPD   breath sounds clear to auscultation       Cardiovascular hypertension, (-) CAD, (-) Past MI, (-) Cardiac Stents and (-) CABG  Rhythm:Regular Rate:Normal     Neuro/Psych neg Seizures    GI/Hepatic PUD,,,(+) neg Cirrhosis        Endo/Other    Class 4 obesity  Renal/GU Renal disease     Musculoskeletal   Abdominal   Peds  Hematology   Anesthesia Other Findings   Reproductive/Obstetrics                             Anesthesia Physical Anesthesia Plan  ASA: 3  Anesthesia Plan: General   Post-op Pain Management:    Induction: Intravenous  PONV Risk Score and Plan: 2  Airway Management Planned: Oral ETT  Additional Equipment:   Intra-op Plan:   Post-operative Plan: Extubation in OR  Informed Consent: I have reviewed the patients History and Physical, chart, labs and discussed the procedure including the risks, benefits and alternatives for the proposed anesthesia with the patient or authorized representative who has indicated his/her understanding and acceptance.     Dental advisory given  Plan Discussed with: CRNA  Anesthesia Plan Comments:        Anesthesia Quick Evaluation

## 2023-06-19 ENCOUNTER — Other Ambulatory Visit: Payer: Self-pay | Admitting: Obstetrics & Gynecology

## 2023-06-19 ENCOUNTER — Encounter (HOSPITAL_COMMUNITY): Payer: Self-pay | Admitting: Orthopaedic Surgery

## 2023-06-19 DIAGNOSIS — Z1231 Encounter for screening mammogram for malignant neoplasm of breast: Secondary | ICD-10-CM

## 2023-06-29 ENCOUNTER — Ambulatory Visit (INDEPENDENT_AMBULATORY_CARE_PROVIDER_SITE_OTHER): Payer: Commercial Managed Care - HMO | Admitting: Orthopaedic Surgery

## 2023-06-29 ENCOUNTER — Encounter: Payer: Self-pay | Admitting: Orthopaedic Surgery

## 2023-06-29 DIAGNOSIS — Z9889 Other specified postprocedural states: Secondary | ICD-10-CM

## 2023-06-29 DIAGNOSIS — S83231D Complex tear of medial meniscus, current injury, right knee, subsequent encounter: Secondary | ICD-10-CM

## 2023-06-29 MED ORDER — COLCHICINE 0.6 MG PO TABS: 0.6000 mg | ORAL_TABLET | Freq: Every day | ORAL | 1 refills | Status: AC

## 2023-06-29 NOTE — Progress Notes (Signed)
This is the first postoperative visit for this patient status post a right knee arthroscopy.  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  We performed a partial medial meniscectomy as well as a chondroplasty of the medial and lateral compartments of her knee.  She is someone who has been dealing with chronic knee pain but also locking and catching.  She understands that the arthroscopic portion of the case would not address arthritis in her knee.  She is also someone who is morbidly obese with a high BMI and is not a candidate for knee replacement surgery as of yet.  However, her symptoms are more related to the meniscal tear and not so much to having more cartilage in her knee thus we felt that an arthroscopic intervention would be worth undertaking and she agreed with this as well.  She is doing fairly well overall.  Her right knee shows no swelling or effusion.  She has good range of motion of the knee and less pain.  She did let me know she is having an acute flareup of gout.  She would like a medication that helps with acute gout attacks rather than being on something on a daily basis long-term.  I will send in some colchicine for her.  We talked about using this as well.  From my standpoint as far as her knee goes, we talked about the cartilage loss in her knee and she may be a candidate later for hyaluronic acid.  She can get back into the gym exercising.  I would like to see her back in a month to see how her right knee is doing overall.

## 2023-07-27 ENCOUNTER — Encounter: Payer: Medicare Other | Admitting: Orthopaedic Surgery

## 2023-08-17 ENCOUNTER — Encounter: Payer: Self-pay | Admitting: Orthopaedic Surgery

## 2023-08-17 ENCOUNTER — Ambulatory Visit (INDEPENDENT_AMBULATORY_CARE_PROVIDER_SITE_OTHER): Payer: Medicare Other | Admitting: Orthopaedic Surgery

## 2023-08-17 DIAGNOSIS — S83231D Complex tear of medial meniscus, current injury, right knee, subsequent encounter: Secondary | ICD-10-CM

## 2023-08-17 DIAGNOSIS — M1711 Unilateral primary osteoarthritis, right knee: Secondary | ICD-10-CM

## 2023-08-17 DIAGNOSIS — Z9889 Other specified postprocedural states: Secondary | ICD-10-CM

## 2023-08-17 NOTE — Progress Notes (Signed)
The patient is now 2 months status post a right knee arthroscopy where we did perform a partial meniscectomy and found grade III chondromalacia.  Her knees do have valgus malalignment.  She does report that she is lost weight since surgery and she is working on getting up from a chair easily or but she still has a hard time getting up from a low sitting chair.  She is overall doing better she states and is ready to get back into a gym.  Examination shows her knees have valgus malalignment in both hyperextend.  Her right operative knee shows no effusion with good range of motion.  I want her to continue to work on her weight loss and quad strengthening exercises.  At this point we can release her to follow-up as needed.  If she does develop arthritic type of pain in that knee, I would recommend hyaluronic acid for her knee.  She will let us know if she has any issues.  Follow-up is as needed.

## 2023-09-21 DIAGNOSIS — Z79899 Other long term (current) drug therapy: Secondary | ICD-10-CM | POA: Diagnosis not present

## 2023-09-21 DIAGNOSIS — M5431 Sciatica, right side: Secondary | ICD-10-CM | POA: Diagnosis not present

## 2023-09-21 DIAGNOSIS — E559 Vitamin D deficiency, unspecified: Secondary | ICD-10-CM | POA: Diagnosis not present

## 2023-09-21 DIAGNOSIS — E1169 Type 2 diabetes mellitus with other specified complication: Secondary | ICD-10-CM | POA: Diagnosis not present

## 2023-09-21 DIAGNOSIS — I10 Essential (primary) hypertension: Secondary | ICD-10-CM | POA: Diagnosis not present

## 2023-09-21 DIAGNOSIS — E7800 Pure hypercholesterolemia, unspecified: Secondary | ICD-10-CM | POA: Diagnosis not present

## 2023-09-21 DIAGNOSIS — Z0000 Encounter for general adult medical examination without abnormal findings: Secondary | ICD-10-CM | POA: Diagnosis not present

## 2023-09-23 ENCOUNTER — Ambulatory Visit

## 2023-09-24 ENCOUNTER — Ambulatory Visit
Admission: RE | Admit: 2023-09-24 | Discharge: 2023-09-24 | Disposition: A | Discharge status: Home / Self Care | Source: Ambulatory Visit | Attending: Obstetrics & Gynecology | Admitting: Obstetrics & Gynecology

## 2023-09-24 DIAGNOSIS — Z1231 Encounter for screening mammogram for malignant neoplasm of breast: Secondary | ICD-10-CM | POA: Diagnosis not present

## 2023-10-10 DIAGNOSIS — I10 Essential (primary) hypertension: Secondary | ICD-10-CM | POA: Diagnosis not present

## 2023-11-18 DIAGNOSIS — Z7985 Long-term (current) use of injectable non-insulin antidiabetic drugs: Secondary | ICD-10-CM | POA: Diagnosis not present

## 2023-11-18 DIAGNOSIS — I10 Essential (primary) hypertension: Secondary | ICD-10-CM | POA: Diagnosis not present

## 2023-11-18 DIAGNOSIS — Z13220 Encounter for screening for lipoid disorders: Secondary | ICD-10-CM | POA: Diagnosis not present

## 2023-11-18 DIAGNOSIS — Z0000 Encounter for general adult medical examination without abnormal findings: Secondary | ICD-10-CM | POA: Diagnosis not present

## 2023-11-18 DIAGNOSIS — Z1211 Encounter for screening for malignant neoplasm of colon: Secondary | ICD-10-CM | POA: Diagnosis not present

## 2023-12-07 DIAGNOSIS — N3000 Acute cystitis without hematuria: Secondary | ICD-10-CM | POA: Diagnosis not present

## 2023-12-24 DIAGNOSIS — M79674 Pain in right toe(s): Secondary | ICD-10-CM | POA: Diagnosis not present

## 2023-12-24 DIAGNOSIS — M79671 Pain in right foot: Secondary | ICD-10-CM | POA: Diagnosis not present

## 2023-12-26 ENCOUNTER — Other Ambulatory Visit: Payer: Self-pay

## 2023-12-26 ENCOUNTER — Emergency Department (HOSPITAL_BASED_OUTPATIENT_CLINIC_OR_DEPARTMENT_OTHER)
Admission: EM | Admit: 2023-12-26 | Discharge: 2023-12-26 | Disposition: A | Discharge status: Home / Self Care | Attending: Emergency Medicine | Admitting: Emergency Medicine

## 2023-12-26 ENCOUNTER — Encounter (HOSPITAL_BASED_OUTPATIENT_CLINIC_OR_DEPARTMENT_OTHER): Payer: Self-pay | Admitting: Emergency Medicine

## 2023-12-26 DIAGNOSIS — I10 Essential (primary) hypertension: Secondary | ICD-10-CM | POA: Diagnosis not present

## 2023-12-26 DIAGNOSIS — R300 Dysuria: Secondary | ICD-10-CM | POA: Diagnosis not present

## 2023-12-26 LAB — URINALYSIS, W/ REFLEX TO CULTURE (INFECTION SUSPECTED)
Bilirubin Urine: NEGATIVE
Glucose, UA: NEGATIVE mg/dL
Hgb urine dipstick: NEGATIVE
Ketones, ur: NEGATIVE mg/dL
Leukocytes,Ua: NEGATIVE
Nitrite: NEGATIVE
Protein, ur: NEGATIVE mg/dL
RBC / HPF: NONE SEEN RBC/hpf (ref 0–5)
Specific Gravity, Urine: 1.025 (ref 1.005–1.030)
pH: 6 (ref 5.0–8.0)

## 2023-12-26 LAB — WET PREP, GENITAL
Clue Cells Wet Prep HPF POC: NONE SEEN
Sperm: NONE SEEN
Trich, Wet Prep: NONE SEEN
WBC, Wet Prep HPF POC: <10 (ref <10)
Yeast Wet Prep HPF POC: NONE SEEN

## 2023-12-26 MED ORDER — CEPHALEXIN 500 MG PO CAPS: 500.0000 mg | ORAL_CAPSULE | Freq: Two times a day (BID) | ORAL | 0 refills | Status: AC

## 2023-12-26 NOTE — ED Triage Notes (Signed)
 Pt c/o dysuria intermittently x 2 wks

## 2023-12-26 NOTE — ED Provider Notes (Signed)
 Emergency Department Provider Note   I have reviewed the triage vital signs and the nursing notes.   HISTORY  Chief Complaint Dysuria   HPI Jacqueline Mcclure is a 68 y.o. female with past history reviewed below presents emergency department with occasional burning with urination.  Patient has had approximately 2 weeks of symptoms on and off.  No vaginal discharge or bleeding.  No pelvic/abdominal/flank pain.  No fevers or chills.  Patient verbalizes concern for possible UTI but also wondering about other potential infection such as yeast infection or STI.  Notes she would like to be checked for these as well.   Past Medical History:  Diagnosis Date   Fibroid    History of migraine    Hypertension    Morbid obesity (HCC)    Peptic ulcer disease 1998   normal EGD 2004   Pneumonia    Pre-diabetes     Review of Systems  Constitutional: No fever/chills Gastrointestinal: No abdominal pain.  Genitourinary: Positive for dysuria. Musculoskeletal: Negative for back pain. Skin: Negative for rash. Neurological: Negative for headaches.  ____________________________________________   PHYSICAL EXAM:  VITAL SIGNS: ED Triage Vitals  Encounter Vitals Group     BP 12/26/23 2014 118/75     Pulse Rate 12/26/23 2014 98     Resp 12/26/23 2014 16     Temp 12/26/23 2014 (!) 97.5 F (36.4 C)     Temp src --      SpO2 12/26/23 2014 99 %     Weight 12/26/23 2013 282 lb 3 oz (128 kg)     Height 12/26/23 2013 5' 5 (1.651 m)   Constitutional: Alert and oriented. Well appearing and in no acute distress. Eyes: Conjunctivae are normal.  Head: Atraumatic. Nose: No congestion/rhinnorhea. Mouth/Throat: Mucous membranes are moist.  Neck: No stridor.  Cardiovascular: Good peripheral circulation.  Respiratory: Normal respiratory effort.   Gastrointestinal: No distention.  Musculoskeletal: No gross deformities of extremities. Neurologic:  Normal speech and language.  Skin:  Skin  is warm, dry and intact. No rash noted.  ____________________________________________   LABS (all labs ordered are listed, but only abnormal results are displayed)  Labs Reviewed  WET PREP, GENITAL  URINALYSIS, W/ REFLEX TO CULTURE (INFECTION SUSPECTED)  GC/CHLAMYDIA PROBE AMP ([place]) NOT AT [facility]   ____________________________________________   PROCEDURES  Procedure(s) performed:   Procedures  None  ____________________________________________   INITIAL IMPRESSION / ASSESSMENT AND PLAN / ED COURSE  Pertinent labs & imaging results that were available during my care of the patient were reviewed by me and considered in my medical decision making (see chart for details).   This patient is Presenting for Evaluation of dysuria, which does require a range of treatment options, and is a complaint that involves a high risk of morbidity and mortality.  The Differential Diagnoses include UTI, STD, BV, etc.   Clinical Laboratory Tests Ordered, included ***  Medical Decision Making: Summary:  Patient with some mild dysuria.  Vitals not consistent with sepsis.  Plan for UA and self swab for wet prep and gonorrhea/chlamydia.  Reevaluation with update and discussion with   ***Considered admission***  Patient's presentation is most consistent with acute, uncomplicated illness.   Disposition:   ____________________________________________  FINAL CLINICAL IMPRESSION(S) / ED DIAGNOSES  Final diagnoses:  None     NEW OUTPATIENT MEDICATIONS STARTED DURING THIS VISIT:  New Prescriptions   No medications on file    Note:  This document was prepared using Dragon voice recognition  software and may include unintentional dictation errors.  Fonda Law, MD, [facility]

## 2023-12-26 NOTE — Discharge Instructions (Signed)
 Please take the antibiotics as prescribed. Follow your remaining test results in the MyChart app in the coming days.

## 2023-12-29 LAB — GC/CHLAMYDIA PROBE AMP (~~LOC~~) NOT AT ARMC
Chlamydia: NEGATIVE
Comment: NEGATIVE
Comment: NORMAL
Neisseria Gonorrhea: NEGATIVE

## 2024-03-24 ENCOUNTER — Ambulatory Visit (HOSPITAL_BASED_OUTPATIENT_CLINIC_OR_DEPARTMENT_OTHER): Admitting: Obstetrics & Gynecology

## 2024-05-02 ENCOUNTER — Encounter: Payer: Self-pay | Admitting: Radiology

## 2024-05-10 ENCOUNTER — Ambulatory Visit (HOSPITAL_BASED_OUTPATIENT_CLINIC_OR_DEPARTMENT_OTHER): Admitting: Obstetrics & Gynecology

## 2024-09-08 ENCOUNTER — Ambulatory Visit (HOSPITAL_BASED_OUTPATIENT_CLINIC_OR_DEPARTMENT_OTHER): Admitting: Obstetrics & Gynecology
